# Patient Record
Sex: Male | Born: 1935 | Race: Black or African American | Hispanic: No | Marital: Single | Smoking: Former smoker
Health system: Southern US, Community
[De-identification: ages and names within clinical notes are randomized; demographics above are authoritative.]

## PROBLEM LIST (undated history)

## (undated) DIAGNOSIS — E440 Moderate protein-calorie malnutrition: Secondary | ICD-10-CM

## (undated) DIAGNOSIS — I10 Essential (primary) hypertension: Secondary | ICD-10-CM

## (undated) DIAGNOSIS — R51 Headache: Secondary | ICD-10-CM

## (undated) DIAGNOSIS — C801 Malignant (primary) neoplasm, unspecified: Secondary | ICD-10-CM

## (undated) DIAGNOSIS — F0280 Dementia in other diseases classified elsewhere without behavioral disturbance: Secondary | ICD-10-CM

## (undated) DIAGNOSIS — R519 Headache, unspecified: Secondary | ICD-10-CM

## (undated) DIAGNOSIS — G309 Alzheimer's disease, unspecified: Secondary | ICD-10-CM

## (undated) DIAGNOSIS — I639 Cerebral infarction, unspecified: Secondary | ICD-10-CM

## (undated) HISTORY — PX: FRACTURE SURGERY: SHX138

---

## 2012-07-16 ENCOUNTER — Other Ambulatory Visit (HOSPITAL_COMMUNITY): Payer: Self-pay | Admitting: Family Medicine

## 2012-07-16 DIAGNOSIS — R918 Other nonspecific abnormal finding of lung field: Secondary | ICD-10-CM

## 2012-07-18 ENCOUNTER — Ambulatory Visit (HOSPITAL_COMMUNITY)
Admission: RE | Admit: 2012-07-18 | Discharge: 2012-07-18 | Disposition: A | Discharge status: Home / Self Care | Payer: Medicare Other | Source: Ambulatory Visit | Attending: Family Medicine | Admitting: Family Medicine

## 2012-07-18 DIAGNOSIS — R918 Other nonspecific abnormal finding of lung field: Secondary | ICD-10-CM | POA: Insufficient documentation

## 2012-07-18 DIAGNOSIS — R222 Localized swelling, mass and lump, trunk: Secondary | ICD-10-CM | POA: Insufficient documentation

## 2012-07-18 DIAGNOSIS — K7689 Other specified diseases of liver: Secondary | ICD-10-CM | POA: Insufficient documentation

## 2012-07-18 LAB — POCT I-STAT, CHEM 8
Chloride: 102 mEq/L (ref 96–112)
Glucose, Bld: 115 mg/dL — ABNORMAL HIGH (ref 70–99)
HCT: 43 % (ref 39.0–52.0)
Hemoglobin: 14.6 g/dL (ref 13.0–17.0)
Potassium: 4.3 mEq/L (ref 3.5–5.1)
Sodium: 138 mEq/L (ref 135–145)

## 2012-07-18 MED ORDER — IOHEXOL 300 MG/ML  SOLN
100.0000 mL | Freq: Once | INTRAMUSCULAR | Status: AC | PRN
  Administered 2012-07-18: 80 mL via INTRAVENOUS

## 2012-07-25 ENCOUNTER — Other Ambulatory Visit (HOSPITAL_COMMUNITY): Payer: Self-pay | Admitting: Family Medicine

## 2012-07-25 DIAGNOSIS — R918 Other nonspecific abnormal finding of lung field: Secondary | ICD-10-CM

## 2012-07-30 ENCOUNTER — Other Ambulatory Visit: Payer: Self-pay | Admitting: Radiology

## 2012-07-30 ENCOUNTER — Other Ambulatory Visit (HOSPITAL_COMMUNITY): Payer: Medicare Other

## 2012-07-31 ENCOUNTER — Ambulatory Visit (HOSPITAL_COMMUNITY)
Admission: RE | Admit: 2012-07-31 | Discharge: 2012-07-31 | Disposition: A | Discharge status: Home / Self Care | Payer: Medicare Other | Source: Ambulatory Visit | Attending: Family Medicine | Admitting: Family Medicine

## 2012-07-31 ENCOUNTER — Encounter (HOSPITAL_COMMUNITY): Payer: Self-pay

## 2012-07-31 ENCOUNTER — Ambulatory Visit (HOSPITAL_COMMUNITY)
Admission: RE | Admit: 2012-07-31 | Discharge: 2012-07-31 | Disposition: A | Discharge status: Home / Self Care | Payer: Medicare Other | Source: Ambulatory Visit | Attending: Diagnostic Radiology | Admitting: Diagnostic Radiology

## 2012-07-31 ENCOUNTER — Ambulatory Visit (HOSPITAL_COMMUNITY)
Admission: RE | Admit: 2012-07-31 | Discharge: 2012-07-31 | Disposition: A | Discharge status: Home / Self Care | Payer: Medicare Other | Source: Ambulatory Visit | Attending: Radiology | Admitting: Radiology

## 2012-07-31 DIAGNOSIS — R222 Localized swelling, mass and lump, trunk: Secondary | ICD-10-CM | POA: Insufficient documentation

## 2012-07-31 DIAGNOSIS — C3410 Malignant neoplasm of upper lobe, unspecified bronchus or lung: Secondary | ICD-10-CM | POA: Insufficient documentation

## 2012-07-31 DIAGNOSIS — R918 Other nonspecific abnormal finding of lung field: Secondary | ICD-10-CM

## 2012-07-31 DIAGNOSIS — F0150 Vascular dementia without behavioral disturbance: Secondary | ICD-10-CM | POA: Insufficient documentation

## 2012-07-31 HISTORY — DX: Cerebral infarction, unspecified: I63.9

## 2012-07-31 HISTORY — DX: Alzheimer's disease, unspecified: G30.9

## 2012-07-31 HISTORY — DX: Essential (primary) hypertension: I10

## 2012-07-31 HISTORY — DX: Dementia in other diseases classified elsewhere, unspecified severity, without behavioral disturbance, psychotic disturbance, mood disturbance, and anxiety: F02.80

## 2012-07-31 LAB — CBC
Hemoglobin: 12.9 g/dL — ABNORMAL LOW (ref 13.0–17.0)
MCH: 29.5 pg (ref 26.0–34.0)
MCHC: 34.3 g/dL (ref 30.0–36.0)
Platelets: 253 10*3/uL (ref 150–400)
RDW: 14.8 % (ref 11.5–15.5)

## 2012-07-31 LAB — APTT: aPTT: 33 seconds (ref 24–37)

## 2012-07-31 LAB — PROTIME-INR
INR: 1 (ref 0.00–1.49)
Prothrombin Time: 13.1 seconds (ref 11.6–15.2)

## 2012-07-31 MED ORDER — HYDROCODONE-ACETAMINOPHEN 5-325 MG PO TABS: 1.0000 | ORAL_TABLET | ORAL | Status: DC | PRN

## 2012-07-31 MED ORDER — SODIUM CHLORIDE 0.9 % IV SOLN
Freq: Once | INTRAVENOUS | Status: AC
  Administered 2012-07-31: 08:00:00 via INTRAVENOUS

## 2012-07-31 MED ORDER — MIDAZOLAM HCL 2 MG/2ML IJ SOLN
INTRAMUSCULAR | Status: DC | PRN
  Administered 2012-07-31: 1 mg via INTRAVENOUS

## 2012-07-31 MED ORDER — FENTANYL CITRATE 0.05 MG/ML IJ SOLN
INTRAMUSCULAR | Status: DC | PRN
  Administered 2012-07-31: 50 ug via INTRAVENOUS

## 2012-07-31 MED ORDER — MIDAZOLAM HCL 2 MG/2ML IJ SOLN
INTRAMUSCULAR | Status: AC
  Filled 2012-07-31: qty 6

## 2012-07-31 MED ORDER — FENTANYL CITRATE 0.05 MG/ML IJ SOLN
INTRAMUSCULAR | Status: AC
  Filled 2012-07-31: qty 4

## 2012-07-31 NOTE — Procedures (Signed)
CT guided biopsy of left upper lobe mass.  3 cores obtained.  No immediate complication.

## 2012-07-31 NOTE — Progress Notes (Signed)
Eval of pt after observation of post Bx PTX. Repeat CXR shows no change/worsening of (L)PTX, 5-10% Pt feeling fine, denies acute SOB or chest discomfort. Bx site clean, NT, no sub-q emphysema palpable.  Pt ok for discharge. Discussed precautions and sxs to be aware of.  [place], [facility]

## 2012-07-31 NOTE — H&P (Signed)
William Walters is an 79 y.o. male.   Chief Complaint: Left lung mass Hx lung Ca yrs ago- refused treatment then Now living with Dtr in [place]; evaluated by Dr Everlene Other Now scheduled for Left lung mass Bx Need dx for possible treatment HPI: CVA; HTN; mild confusion  Past Medical History  Diagnosis Date  . Hypertension   . Stroke   . Alzheimer disease     History reviewed. No pertinent past surgical history.  No family history on file. Social History:  has no tobacco, alcohol, and drug history on file.  Allergies: No Known Allergies   (Not in a hospital admission)  Results for orders placed during the hospital encounter of 07/31/12 (from the past 48 hour(s))  APTT     Status: None   Collection Time    07/31/12  7:50 AM      Result Value Range   aPTT 33  24 - 37 seconds  CBC     Status: Abnormal   Collection Time    07/31/12  7:50 AM      Result Value Range   WBC 12.6 (*) 4.0 - 10.5 K/uL   RBC 4.38  4.22 - 5.81 MIL/uL   Hemoglobin 12.9 (*) 13.0 - 17.0 g/dL   HCT 14.7 (*) 82.9 - 56.2 %   MCV 85.8  78.0 - 100.0 fL   MCH 29.5  26.0 - 34.0 pg   MCHC 34.3  30.0 - 36.0 g/dL   RDW 13.0  86.5 - 78.4 %   Platelets 253  150 - 400 K/uL  PROTIME-INR     Status: None   Collection Time    07/31/12  7:50 AM      Result Value Range   Prothrombin Time 13.1  11.6 - 15.2 seconds   INR 1.00  0.00 - 1.49   No results found.  Review of Systems  Constitutional: Positive for weight loss. Negative for fever.  Respiratory: Positive for shortness of breath.   Cardiovascular: Negative for chest pain.  Gastrointestinal: Negative for nausea and vomiting.  Neurological: Positive for focal weakness and weakness.       Rt side arm without use; Rt leg weak post CVA  Psychiatric/Behavioral: Positive for memory loss.       Mild confusion    Blood pressure 153/91, pulse 113, temperature 96.8 F (36 C), temperature source Oral, resp. rate 16, height 6\' 1"  (1.854 m), weight 145 lb (65.772 kg), SpO2  92.00%. Physical Exam  Constitutional: He is oriented to person, place, and time.  Thin; weak; frail  Cardiovascular: Normal rate, regular rhythm and normal heart sounds.   No murmur heard. Respiratory: Effort normal and breath sounds normal. He has no wheezes.  GI: Soft. Bowel sounds are normal. There is no tenderness.  Musculoskeletal:  Moves left well Rt arm; no use Rt leg able to use minimally Rt foot tender; swollen  Neurological: He is alert and oriented to person, place, and time.  Psychiatric: He has a normal mood and affect. His behavior is normal.  Mild confusion Alert and oriented to person; place; time; reason for procedure     Assessment/Plan Left lung mass; Hx Lung Ca yrs ago per chart Refused treatment then Scheduled for lung mass bx now Pt aware of procedure benefits and risks and agreeable to proceed I also spoke to DTR on phone- she is aware of procedure  benefits and risks and agreeable to proceed Consent signed and in chart  Rasheema Truluck A 07/31/2012, 8:49 AM

## 2012-07-31 NOTE — ED Notes (Signed)
For CXRay at 12:15, will transport to C1 after.

## 2012-08-05 ENCOUNTER — Telehealth: Payer: Self-pay | Admitting: *Deleted

## 2012-08-05 NOTE — Telephone Encounter (Signed)
Spoke with daughter regarding appt.  She is unable to bring pt at this time and will call back

## 2012-08-11 ENCOUNTER — Telehealth: Payer: Self-pay | Admitting: *Deleted

## 2012-08-11 NOTE — Telephone Encounter (Signed)
Spoke with family regarding appt.  She ask I call back and leave on vm.  I called with appt [facility] 08/21/12 at 3:45 with location.

## 2012-08-15 ENCOUNTER — Telehealth: Payer: Self-pay | Admitting: *Deleted

## 2012-08-15 NOTE — Telephone Encounter (Signed)
Spoke with daughter regarding appt next week.  Surgeon would like PET scan before seeing pt.  I tried to call referring office with no answer.  I will call Monday to scheudle PET.  Daughter verbalized understanding off appt canceled for 08/21/12

## 2012-08-19 ENCOUNTER — Telehealth: Payer: Self-pay | Admitting: *Deleted

## 2012-08-19 ENCOUNTER — Other Ambulatory Visit (HOSPITAL_COMMUNITY): Payer: Self-pay | Admitting: Family Medicine

## 2012-08-19 ENCOUNTER — Encounter: Payer: Self-pay | Admitting: *Deleted

## 2012-08-19 DIAGNOSIS — C3490 Malignant neoplasm of unspecified part of unspecified bronchus or lung: Secondary | ICD-10-CM

## 2012-08-19 NOTE — Progress Notes (Signed)
Called referring office regarding PET scan.  They will order scan

## 2012-08-22 ENCOUNTER — Other Ambulatory Visit (HOSPITAL_COMMUNITY): Payer: Medicare Other

## 2012-08-26 ENCOUNTER — Encounter (HOSPITAL_COMMUNITY)
Admission: RE | Admit: 2012-08-26 | Discharge: 2012-08-26 | Disposition: A | Discharge status: Home / Self Care | Payer: Medicare Other | Source: Ambulatory Visit | Attending: Family Medicine | Admitting: Family Medicine

## 2012-08-26 DIAGNOSIS — C3490 Malignant neoplasm of unspecified part of unspecified bronchus or lung: Secondary | ICD-10-CM | POA: Insufficient documentation

## 2012-08-26 MED ORDER — FLUDEOXYGLUCOSE F - 18 (FDG) INJECTION
16.3000 | Freq: Once | INTRAVENOUS | Status: AC | PRN
  Administered 2012-08-26: 16.3 via INTRAVENOUS

## 2012-08-28 ENCOUNTER — Emergency Department (HOSPITAL_COMMUNITY): Payer: Medicare Other

## 2012-08-28 ENCOUNTER — Other Ambulatory Visit: Payer: Self-pay

## 2012-08-28 ENCOUNTER — Encounter (HOSPITAL_COMMUNITY): Payer: Self-pay | Admitting: Emergency Medicine

## 2012-08-28 ENCOUNTER — Encounter: Payer: Self-pay | Admitting: Radiation Oncology

## 2012-08-28 ENCOUNTER — Encounter: Payer: Self-pay | Admitting: *Deleted

## 2012-08-28 ENCOUNTER — Ambulatory Visit
Admission: RE | Admit: 2012-08-28 | Discharge: 2012-08-28 | Disposition: A | Discharge status: Home / Self Care | Payer: Medicare Other | Source: Ambulatory Visit | Attending: Radiation Oncology | Admitting: Radiation Oncology

## 2012-08-28 ENCOUNTER — Telehealth: Payer: Self-pay | Admitting: *Deleted

## 2012-08-28 ENCOUNTER — Institutional Professional Consult (permissible substitution) (INDEPENDENT_AMBULATORY_CARE_PROVIDER_SITE_OTHER): Payer: Medicare Other | Admitting: Thoracic Surgery (Cardiothoracic Vascular Surgery)

## 2012-08-28 ENCOUNTER — Emergency Department (HOSPITAL_COMMUNITY)
Admission: EM | Admit: 2012-08-28 | Discharge: 2012-08-28 | Disposition: A | Discharge status: Home / Self Care | Payer: Medicare Other | Attending: Emergency Medicine | Admitting: Emergency Medicine

## 2012-08-28 ENCOUNTER — Other Ambulatory Visit: Payer: Self-pay | Admitting: *Deleted

## 2012-08-28 DIAGNOSIS — Z79899 Other long term (current) drug therapy: Secondary | ICD-10-CM | POA: Insufficient documentation

## 2012-08-28 DIAGNOSIS — L97909 Non-pressure chronic ulcer of unspecified part of unspecified lower leg with unspecified severity: Secondary | ICD-10-CM

## 2012-08-28 DIAGNOSIS — F0280 Dementia in other diseases classified elsewhere without behavioral disturbance: Secondary | ICD-10-CM

## 2012-08-28 DIAGNOSIS — R059 Cough, unspecified: Secondary | ICD-10-CM | POA: Insufficient documentation

## 2012-08-28 DIAGNOSIS — I10 Essential (primary) hypertension: Secondary | ICD-10-CM | POA: Insufficient documentation

## 2012-08-28 DIAGNOSIS — C3490 Malignant neoplasm of unspecified part of unspecified bronchus or lung: Secondary | ICD-10-CM | POA: Insufficient documentation

## 2012-08-28 DIAGNOSIS — I2699 Other pulmonary embolism without acute cor pulmonale: Secondary | ICD-10-CM | POA: Insufficient documentation

## 2012-08-28 DIAGNOSIS — Z8673 Personal history of transient ischemic attack (TIA), and cerebral infarction without residual deficits: Secondary | ICD-10-CM | POA: Insufficient documentation

## 2012-08-28 DIAGNOSIS — C3410 Malignant neoplasm of upper lobe, unspecified bronchus or lung: Secondary | ICD-10-CM

## 2012-08-28 DIAGNOSIS — R918 Other nonspecific abnormal finding of lung field: Secondary | ICD-10-CM

## 2012-08-28 DIAGNOSIS — R222 Localized swelling, mass and lump, trunk: Secondary | ICD-10-CM | POA: Insufficient documentation

## 2012-08-28 DIAGNOSIS — G309 Alzheimer's disease, unspecified: Secondary | ICD-10-CM | POA: Insufficient documentation

## 2012-08-28 DIAGNOSIS — F0390 Unspecified dementia without behavioral disturbance: Secondary | ICD-10-CM

## 2012-08-28 DIAGNOSIS — R05 Cough: Secondary | ICD-10-CM | POA: Insufficient documentation

## 2012-08-28 LAB — CBC WITH DIFFERENTIAL/PLATELET
Basophils Relative: 1 % (ref 0–1)
Eosinophils Absolute: 0.7 10*3/uL (ref 0.0–0.7)
Eosinophils Relative: 7 % — ABNORMAL HIGH (ref 0–5)
HCT: 34.2 % — ABNORMAL LOW (ref 39.0–52.0)
Hemoglobin: 11.2 g/dL — ABNORMAL LOW (ref 13.0–17.0)
MCH: 28.6 pg (ref 26.0–34.0)
MCHC: 32.7 g/dL (ref 30.0–36.0)
MCV: 87.2 fL (ref 78.0–100.0)
Monocytes Absolute: 0.7 10*3/uL (ref 0.1–1.0)
Monocytes Relative: 7 % (ref 3–12)

## 2012-08-28 LAB — COMPREHENSIVE METABOLIC PANEL
Albumin: 3.1 g/dL — ABNORMAL LOW (ref 3.5–5.2)
BUN: 13 mg/dL (ref 6–23)
Calcium: 9.1 mg/dL (ref 8.4–10.5)
Chloride: 99 mEq/L (ref 96–112)
Creatinine, Ser: 1.12 mg/dL (ref 0.50–1.35)
Total Bilirubin: 0.2 mg/dL — ABNORMAL LOW (ref 0.3–1.2)
Total Protein: 7.4 g/dL (ref 6.0–8.3)

## 2012-08-28 LAB — TROPONIN I: Troponin I: <0.3 ng/mL (ref <0.30)

## 2012-08-28 LAB — PROTIME-INR: Prothrombin Time: 12.4 seconds (ref 11.6–15.2)

## 2012-08-28 MED ORDER — RIVAROXABAN 15 MG PO TABS: 15.0000 mg | ORAL_TABLET | Freq: Two times a day (BID) | ORAL | Status: DC

## 2012-08-28 MED ORDER — IOHEXOL 350 MG/ML SOLN
100.0000 mL | Freq: Once | INTRAVENOUS | Status: AC | PRN
  Administered 2012-08-28: 100 mL via INTRAVENOUS

## 2012-08-28 MED ORDER — ENOXAPARIN SODIUM 60 MG/0.6ML ~~LOC~~ SOLN
65.0000 mg | Freq: Once | SUBCUTANEOUS | Status: DC
Start: 2012-08-28 — End: 2012-08-28
  Filled 2012-08-28: qty 1.2

## 2012-08-28 MED ORDER — ENOXAPARIN SODIUM 80 MG/0.8ML ~~LOC~~ SOLN
65.0000 mg | SUBCUTANEOUS | Status: AC
  Administered 2012-08-28: 65 mg via SUBCUTANEOUS
  Filled 2012-08-28: qty 0.8

## 2012-08-28 NOTE — Progress Notes (Signed)
PCP is Aura Dials, MD Referring Provider is No ref. provider found  No chief complaint on file.   HPI: William Walters is a 79 year old gentleman with a history of tobacco abuse and for evaluation of a left upper lobe mass.  79 year old gentleman with a history of multiple strokes with residual right hemiparesis and Alzheimer's. He also has a history of tobacco abuse. It is unclear exactly when he quit smoking as he says 55 years and his daughters think it's more like 5 years ago. Apparently was smoking about a pack a day at one time. He had been living in [place] until recently moving to [place] in January. His daughter states he may have been on a blood thinner at one time, but he was not taking it when he moved here. He currently is in a wheelchair and is in a wheelchair at much of the time. He says he can walk somewhat a cane. He has essentially no use of the right arm. His daughter say that he has had difficulty with speech and he has severe memory problems.  He apparently have been fully of lung cancer before but had refused treatment. After moving to [facility] he was seen by Dr. Saunders Glance. A CT scan was done on 07/31/2012 which showed a large left upper lobe mass abutting the mediastinum. This led to a PET/CT which showed the mass to be hypermetabolic. There was no evidence of hilar or mediastinal adenopathy or distant metastases. Dr. Everlene Other ordered a biopsy, which revealed squamous cell carcinoma.  Zubrod  Score = 3 Past Medical History  Diagnosis Date  . Hypertension   . Stroke   . Alzheimer disease     No past surgical history on file.  No family history on file.  Social History History  Substance Use Topics  . Smoking status: Not on file  . Smokeless tobacco: Not on file  . Alcohol Use: Not on file   one pack per day for approximately 50 years  Current Outpatient Prescriptions  Medication Sig Dispense Refill  . captopril (CAPOTEN) 50 MG tablet Take 100 mg by  mouth 2 (two) times daily.      . cyanocobalamin (,VITAMIN B-12,) 1000 MCG/ML injection Inject 1,000 mcg into the muscle See admin instructions. Patient recieves every other month.      . hydrochlorothiazide (HYDRODIURIL) 25 MG tablet Take 25 mg by mouth daily.       No current facility-administered medications for this visit.    No Known Allergies  Review of Systems  Unable to perform ROS: Dementia  Constitutional: Positive for activity change. Negative for appetite change and unexpected weight change (Gained weight since moving to [place] to live with daughter).       Limited review of systems due to patient's dementia. Notable findings per the patient's daughters  Respiratory: Positive for cough. Negative for wheezing.   Cardiovascular: Positive for leg swelling (Recent podiatry procedure on right foot). Negative for chest pain.  Neurological: Positive for speech difficulty and weakness (Right arm near complete paralysis, right leg weakness).    BP 120/58  Pulse 85  Temp(Src) 98.5 F (36.9 C)  Resp 20  Wt 145 lb 3.2 oz (65.862 kg) Physical Exam  Vitals reviewed. Constitutional: He is oriented to person, place, and time.  Elderly gentleman in no acute distress  HENT:  Head: Normocephalic and atraumatic.  Eyes: EOM are normal.  Neck: Neck supple. No thyromegaly present.  Cardiovascular: Normal rate and regular rhythm.  Exam reveals no  gallop and no friction rub.   No murmur heard. Pulmonary/Chest: He has no wheezes.  Diminished breath sounds bilaterally otherwise clear  Abdominal: Soft. There is no tenderness.  Musculoskeletal: He exhibits edema (1+ edema right leg).  Lymphadenopathy:    He has no cervical adenopathy.  Neurological: He is alert and oriented to person, place, and time. No cranial nerve deficit.  Skin: Skin is warm and dry.     Diagnostic Tests: CT chest 07/18/12 *RADIOLOGY REPORT*  Clinical Data: Abnormal chest radiograph  CT CHEST WITH CONTRAST   Technique: Multidetector CT imaging of the chest was performed  following the standard protocol during bolus administration of  intravenous contrast.  Contrast: 80mL OMNIPAQUE IOHEXOL 300 MG/ML SOLN  Comparison: None.  Findings: Within the left upper lobe, there is a rounded 4.9 x 4.3  cm mass which appears to be associated with the left upper lobe  bronchus. Mass extends inferiorly to the level of the left main  pulmonary artery.  There are no pathologic enlarged mediastinal lymph nodes. No  pericardial fluid. Coronary calcifications are present. Esophagus  is normal.  No axillary or supraclavicular lymphadenopathy.  Limited view of the upper abdomen partially images the adrenal  glands which appear normal. Tiny hypodensity within the right  hepatic lobe measuring 5 mm (image 54). Similar 5 ml of  hypodensity within the central left hepatic lobe on the same image  and small right lateral hepatic lobe 4 mm lesion also on the same  image.  No aggressive osseous lesions.  IMPRESSION:  1. Left upper lobe mass extending to the left suprahilar region  is most concerning for bronchogenic carcinoma. Recommend a  bronchoscopy or percutaneous CT guided biopsy for tissue sampling.  2. No evidence of mediastinal lymphadenopathy.  3. Tiny hypodensities within the liver are too small to  characterize. Consider contrast MRI for further evaluation.  4. Consider outpatient FDG PET CT scan for staging.       **ADDENDUM** CREATED: 08/27/2012 09:33:38  Upon further review, there is a filling defect within the right  main pulmonary artery (image 35, series two) consistent with a  pulmonary embolism. These findings will be discussed at the  interdisciplinary thoracic cancer conference on 08/28/2012  **END ADDENDUM** SIGNED BY: Genevive Bi, M.D.   PET/CT 08/26/12 *RADIOLOGY REPORT*  Clinical Data: Initial treatment strategy for lung cancer.  NUCLEAR MEDICINE PET SKULL BASE TO THIGH  Fasting  Blood Glucose: 97  Technique: 16.3 mCi F-18 FDG was injected intravenously. CT data  was obtained and used for attenuation correction and anatomic  localization only. (This was not acquired as a diagnostic CT  examination.) Additional exam technical data entered on  technologist worksheet.  Comparison: Chest CT 07/18/2012.  Findings:  Neck: No hypermetabolic lymph nodes in the neck.  Chest: Again noted is a large macrolobulated mass measuring  approximate 5.3 x 4.9 cm which makes a broad contact with the  pleura adjacent to the lateral aspect of the aortic arch, as well  as anteriorly and superiorly. This lesion is hypermetabolic  (SUVmax = 19.5), although there is relative hypometabolism  centrally, which is likely related to central necrosis in this  biopsy proven squamous cell carcinoma. No definite hypermetabolic  mediastinal or hilar lymphadenopathy. Numerous bilateral hilar and  mediastinal calcified lymph nodes are noted. Heart size is mildly  enlarged. There is atherosclerosis of the thoracic aorta, the great  vessels of the mediastinum and the coronary arteries, including  calcified atherosclerotic plaque in the left main,  left anterior  descending, left circumflex and right coronary arteries. There is a  background of moderate centrilobular emphysema. Patchy ground-  glass attenuation and septal thickening is noted in the right lower  lobe inferiorly, which may represent sequela of recent mild  aspiration. No pneumothorax. No pleural effusions.  Abdomen/Pelvis: No abnormal hypermetabolic activity within the  liver, pancreas, adrenal glands, or spleen. No hypermetabolic  lymph nodes in the abdomen or pelvis. Several calcifications are  noted in the left renal hilum, the majority of which favored to be  vascular. However, there is at least one 5 mm nonobstructive  calculus in the lower pole collecting system of the left kidney  (image 142 of series 2). Extensive  atherosclerosis throughout the  abdominal and pelvic vasculature, without definite aneurysm.  Skeleton: No focal hypermetabolic activity to suggest skeletal  metastasis. Old healed fracture of the right proximal femur with  postoperative changes of cerclage fixation incompletely imaged.  IMPRESSION:  1. 5.3 x 4.9 cm pleural based macrolobulated hypermetabolic left  upper lobe mass with central necrosis, compatible with the biopsy  proven squamous cell carcinoma. No associated mediastinal or hilar  lymphadenopathy, and no definite signs of distal metastatic disease  on today's examination. Findings are compatible with T2b, N0, Mx  disease (i.e., likely stage IIA).  2. 5 mm nonobstructive calculus in the lower pole collecting system  of the left kidney.  3. Atherosclerosis, including left main and three-vessel coronary  artery disease. Assessment for potential risk factor modification,  dietary therapy or pharmacologic therapy may be warranted, if  clinically indicated.  4. Additional incidental findings, as above.  Original Report Authenticated By: Trudie Reed, M.D.    Impression: 79 year old gentleman with a remote history of tobacco abuse who has had multiple strokes and also has a history of Alzheimer's disease. He has a new 5.3 x 4.9 cm left upper lobe mass was hypermetabolic by PET. Biopsy has shown this to be squamous cell carcinoma. He has no evidence of regional or distant metastases. He does however have evidence of a pulmonary embolus in the right main pulmonary artery that was noted on repeat review of the scans.  In my opinion William Walters is not a surgical candidate. There is a good chance that this lesion is actually a T4 lesion invading the mediastinum although does not definite by CT. Given the mass is positioning of the main pulmonary artery it likely would require pneumonectomy. He is not a candidate for a lobectomy based on his level of functioning currently.  I  discussed these issues with the patient and his daughters. He seemed to have limited understanding that his daughters do understand my concerns.  He will be seen by Dr. Margaretmary Dys a radiation oncology to discuss primary radiation therapy.  After he seen by Dr. Kathrynn Running we will send him to the emergency room for probable admission for treatment of his pulmonary embolus.  Plan: Evaluation by radiation oncology  Treat pulmonary embolus  I will be happy to see William Walters back if there is any way I can be of any further assistance with his care

## 2012-08-28 NOTE — ED Notes (Signed)
Patient transported to CT 

## 2012-08-28 NOTE — Progress Notes (Signed)
Admitting called asking for orders for Mr. Larenz Frasier because Mr Doberstein had been advised by Dr. Kathrynn Running  to go to [facility] ED.   During the multidisciplinary thoracic clinic  Today it was noted by Dr. Kathrynn Running and the other participating physicians,  that Mr. Cipollone has a Right Main Pulmonary Artery consistent with a  Pulmonary Embolism on his 07/21/12 CT Scan of his chest.  Mr. Cappelletti was advised to go to the ED to be assessed.  Called Harrell Gave, ED Charge Nurse, and relayed the prior information.  It was determined that Mr. Callens needed to be assessed by an ED physician then proceed to having a repeat CT angiogram. Dr. Kathrynn Running informed.

## 2012-08-28 NOTE — ED Provider Notes (Addendum)
History     CSN: [id number]  Arrival date & time 08/28/12  1743   First MD Initiated Contact with Patient 08/28/12 1758      Chief Complaint  Patient presents with  . Shortness of Breath    (Consider location/radiation/quality/duration/timing/severity/associated sxs/prior treatment) HPI Comments: Patient presents from the radiation oncology clinic with potential pulmonary embolism. He recently diagnosed squamous cell lung cancer and is images are being reviewed today from March. It was thought that he may have a pulmonary embolism so he was sent to the ED. He denies any chest pain, shortness of breath, palpitations. He has a history of Alzheimer's as well as previous stroke and daughter confirms he is at his baseline. Limited movement of his right side. He is not yet received any chemotherapy or radiation. He has had good by mouth intake and urine output. No fevers.  The history is provided by the patient.    Past Medical History  Diagnosis Date  . Hypertension   . Stroke   . Alzheimer disease     History reviewed. No pertinent past surgical history.  History reviewed. No pertinent family history.  History  Substance Use Topics  . Smoking status: Not on file  . Smokeless tobacco: Not on file  . Alcohol Use: Not on file      Review of Systems  Constitutional: Negative for fever, activity change, appetite change and fatigue.  HENT: Negative for congestion and rhinorrhea.   Eyes: Negative for visual disturbance.  Respiratory: Positive for cough. Negative for chest tightness and shortness of breath.   Cardiovascular: Negative for chest pain.  Gastrointestinal: Negative for nausea, vomiting and abdominal pain.  Genitourinary: Negative for testicular pain.  Musculoskeletal: Negative for back pain.  Neurological: Negative for dizziness, weakness and headaches.  A complete 10 system review of systems was obtained and all systems are negative except as noted in the HPI and  PMH.    Allergies  Review of patient's allergies indicates no known allergies.  Home Medications   Current Outpatient Rx  Name  Route  Sig  Dispense  Refill  . captopril (CAPOTEN) 50 MG tablet   Oral   Take 100 mg by mouth 2 (two) times daily.         . hydrochlorothiazide (HYDRODIURIL) 25 MG tablet   Oral   Take 25 mg by mouth daily.         Marland Kitchen sulfamethoxazole-trimethoprim (BACTRIM DS) 800-160 MG per tablet   Oral   Take 1 tablet by mouth 2 (two) times daily.           BP 123/108  Pulse 72  Temp(Src) 98 F (36.7 C) (Oral)  Resp 14  SpO2 100%  Physical Exam  Constitutional: He is oriented to person, place, and time. He appears well-developed and well-nourished. No distress.  HENT:  Head: Normocephalic and atraumatic.  Mouth/Throat: Oropharynx is clear and moist. No oropharyngeal exudate.  Eyes: Conjunctivae and EOM are normal. Pupils are equal, round, and reactive to light.  Neck: Normal range of motion. Neck supple.  Cardiovascular: Normal rate, regular rhythm and normal heart sounds.   Pulmonary/Chest: Effort normal and breath sounds normal. No respiratory distress.  Abdominal: Soft. There is no tenderness. There is no rebound and no guarding.  Musculoskeletal: Normal range of motion. He exhibits no edema and no tenderness.  Neurological: He is alert and oriented to person, place, and time. No cranial nerve deficit. He exhibits normal muscle tone. Coordination normal.  Right arm contracted,  right-sided weakness at baseline  Skin: Skin is warm.    ED Course  Procedures (including critical care time)  Labs Reviewed  CBC WITH DIFFERENTIAL - Abnormal; Notable for the following:    RBC 3.92 (*)    Hemoglobin 11.2 (*)    HCT 34.2 (*)    Eosinophils Relative 7 (*)    All other components within normal limits  COMPREHENSIVE METABOLIC PANEL - Abnormal; Notable for the following:    Sodium 133 (*)    Albumin 3.1 (*)    Total Bilirubin 0.2 (*)    GFR calc non  Af Amer 61 (*)    GFR calc Af Amer 71 (*)    All other components within normal limits  TROPONIN I  PROTIME-INR   Dg Chest 2 View  08/28/2012  *RADIOLOGY REPORT*  Clinical Data: Shortness of breath.  Weakness.Lung mass.  CHEST - 2 VIEW  Comparison: Multiple exams, including 08/26/2012  Findings: Emphysema noted along with a left upper lobe mass similar in size to recent prior examinations.  There is subsegmental atelectasis adjacent to the right hemidiaphragm.  Heart size within normal limits for technique.  No acute thoracic findings are observed.  IMPRESSION:  1.  Essentially stable left upper lobe mass. 2.  Subsegmental atelectasis along the right hemidiaphragm.   Original Report Authenticated By: Gaylyn Rong, M.D.    Ct Angio Chest Pe W/cm &/or Wo Cm  08/28/2012  *RADIOLOGY REPORT*  Clinical Data: Pulmonary embolism.  Lung cancer.  Pulmonary embolism on prior CT.  CT ANGIOGRAPHY CHEST  Technique:  Multidetector CT imaging of the chest using the standard protocol during bolus administration of intravenous contrast. Multiplanar reconstructed images including MIPs were obtained and reviewed to evaluate the vascular anatomy.  Contrast: OMNIPAQUE IOHEXOL 350 MG/ML SOLN  Comparison: 07/18/2012.  Findings: Technically adequate study. Tiny segmental and smaller pulmonary emboli are identified.  Represent of embolus is present in the right upper lobe pulmonary artery (image number 39 series 4).  Left lower lobe pulmonary embolus is also present (image 67 series 5).  There is no right heart strain.  Incidental imaging the upper abdomen demonstrates a tiny low density lesion in the right hepatic lobe probably representing a cyst that appears unchanged compared to prior.  Other smaller lesions are also present, stable compared to prior.  Old granulomatous disease of the spleen.  The spleen is diminutive.  No convincing evidence of an adrenal mass.  Mild thickening of the right adrenal gland appears  stable.  The left upper lobe mass abutting the apex and left mediastinal surface as well as left anterior pleural surface has enlarged compared to the presenting exam of 07/18/2012.  On axial imaging, the mass now measures 57 mm AP by 46 mm transverse (previously 49 mm x 44 mm).  Craniocaudal extent remains the same, with extension from the left hilum to the left apex.  This mass has encased and occluded the left upper lobe pulmonary artery with the stump of the left upper lobe pulmonary artery visible on image number 36.  Presumably, the lung is being fed from collateral flow from the bronchial arteries.  Emphysema is present.  Dependent atelectasis.  Thoracic spondylosis and exaggerated kyphosis.  No aggressive osseous lesions. Aortic arch atherosclerosis. Old granulomatous calcifications in the mediastinal lymph nodes appears similar.  No discrete adenopathy.  IMPRESSION: 1. Positive for pulmonary embolus.  Segmental bilateral pulmonary emboli are present without right heart strain. Probable chronic pulmonary emboli in the descending right pulmonary  artery.  No acute aortic abnormality. 2.  Enlarging left upper lobe mass encasing and occluding the left upper lobe pulmonary artery, today measuring 57 mm x 46 mm on axial imaging. 3.  Emphysema and basilar atelectasis.  Critical Value/emergent results were called by telephone at the time of interpretation on 08/28/2012. at 2021 hours to Dr.Maryland Luppino, who verbally acknowledged these results.   Original Report Authenticated By: Andreas Newport, M.D.      No diagnosis found.    MDM  History of squamous cell lung cancer presenting with concern for pulmonary embolism. He denies chest pain or shortness of breath. He's in no distress lungs are clear.  Chest x-ray is negative. EKG is normal sinus rhythm. CT scan is positive for multiple pulmonary emboli. No evidence of right heart strain. Patient is hemodynamically stable. Will start Lovenox and plan admission to  hospital service.  D/w Dr. Conley Rolls.  Patient remained stable in no distress. He has no hypoxia or tachycardia. CT scan shows improvement in his clot burden from previous scan. No evidence of metastases on recent PET scan.  Dr. Conley Rolls agrees with starting xarelto and having patient followup with oncologist. Platelets and creatinine acceptable. Patient and family in agreement with plan.  Message sent to Dr. Kathrynn Running.   Date: 08/28/2012  Rate: 75  Rhythm: normal sinus rhythm  QRS Axis: normal  Intervals: normal  ST/T Wave abnormalities: normal  Conduction Disutrbances:none  Narrative Interpretation: septal q waves  Old EKG Reviewed: none available      Glynn Octave, MD 08/28/12 [id number]  Glynn Octave, MD 08/29/12 1743

## 2012-08-28 NOTE — ED Notes (Signed)
MD at bedside. 

## 2012-08-28 NOTE — Progress Notes (Signed)
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 male with a history of multiple strokes and Alzheimer's. He also has a history of tobacco abuse. He had been living in [place] until recently moving to [place] in January.  After moving to [facility] he was seen by Dr. Everlene Other. A CT scan was done on 07/31/2012 showed a large left upper lobe mass abutting the mediastinum. This led to a PET/CT which showed the mass to be hypermetabolic. There was no evidence of hilar or mediastinal adenopathy or distant metastases. Dr. Everlene Other ordered a biopsy, which revealed squamous cell carcinoma.  Subsequent PET/CT and confirmed the presence of a 5.3 cm pleural-based macrolobulated hypermetabolic left upper lung mass with central necrosis. There is no mediastinal or hilar lymphadenopathy suggesting stage T2b. N0 (IIA).  The patient was referred for evaluation in our multidisciplinary conference and clinic today. During his radiology presentation in the conference, it pulmonary embolism was suspected in the right pulmonary artery. The patient is not felt to be an ideal surgical candidate given his past medical comorbidities.  PREVIOUS RADIATION THERAPY: No  PAST MEDICAL HISTORY:  has a past medical history of Hypertension; Stroke; and Alzheimer disease.    PAST SURGICAL HISTORY:History reviewed. No pertinent past surgical history.  FAMILY HISTORY: family history is not on file.  SOCIAL HISTORY:    ALLERGIES: Review of patient's allergies indicates no known allergies.  MEDICATIONS:  Current Outpatient Prescriptions  Medication  Sig Dispense Refill  . captopril (CAPOTEN) 50 MG tablet Take 100 mg by mouth 2 (two) times daily.      . cyanocobalamin (,VITAMIN B-12,) 1000 MCG/ML injection Inject 1,000 mcg into the muscle See admin instructions. Patient recieves every other month.      . hydrochlorothiazide (HYDRODIURIL) 25 MG tablet Take 25 mg by mouth daily.       No current facility-administered medications for this encounter.    REVIEW OF SYSTEMS:  A 15 point review of systems is documented in the electronic medical record. This was obtained by the nursing staff. However, I reviewed this with the patient to discuss relevant findings and make appropriate changes.  Pertinent items are noted in HPI.   PHYSICAL EXAM: BP 120/58  Pulse 85  Temp(Src) 98.5 F (36.9 C)  Resp 20  Wt 145 lb 3.2 oz (65.862 kg)  Physical Exam  Vitals reviewed.  Constitutional: He is oriented to person, place, and time.  Elderly gentleman in no acute distress  HENT:  Head: Normocephalic and atraumatic.  Eyes: EOM are normal.  Neck: Neck supple. No thyromegaly present.  Per thoracic surgery:  Cardiovascular: Normal rate and regular rhythm. Exam reveals no gallop and no friction rub.  No murmur heard.  Pulmonary/Chest: He has no wheezes.  Diminished breath sounds bilaterally otherwise clear  Abdominal: Soft. There is no tenderness.  Musculoskeletal: He exhibits edema (1+ edema right leg).  Lymphadenopathy:  He has no cervical adenopathy.  Neurological: He is alert and oriented to person, place, and time. No cranial nerve deficit.  Skin: Skin is warm and dry.     LABORATORY DATA:  Lab Results  Component Value Date  WBC 12.6* 07/31/2012   HGB 12.9* 07/31/2012   HCT 37.6* 07/31/2012   MCV 85.8 07/31/2012   PLT 253 07/31/2012   Lab Results  Component Value Date   NA 138 07/18/2012   K 4.3 07/18/2012   CL 102 07/18/2012   No results found for this basename: ALT,  AST,  GGT,  ALKPHOS,  BILITOT     RADIOGRAPHY: Dg Chest 1  View  07/31/2012  *RADIOLOGY REPORT*  Clinical Data: Post biopsy for left lung mass.  CHEST - 1 VIEW  Comparison: Chest CT 07/18/2012.  Findings: Known left upper lobe mass is again noted.  There appears to be a pleural line both in the left apex and in the lateral aspect of the left mid-upper hemithorax, suspicious for a small left-sided pneumothorax.  Lungs otherwise appear clear.  Pulmonary vasculature is within normal limits.  Heart size is normal. Atherosclerosis in the thoracic aorta.  IMPRESSION: 1.  Findings are suspicious for a small postbiopsy left-sided pneumothorax, and could be confirmed with repeat study if clinically indicated. 2.  Large left upper lobe mass again noted. 3.  Atherosclerosis.  These results were called by telephone on 07/31/2012 at 12:49 p.m. to Dr. Lowella Dandy, who verbally acknowledged these results.   Original Report Authenticated By: Trudie Reed, M.D.    Dg Chest 2 View  07/31/2012  *RADIOLOGY REPORT*  Clinical Data: Status post left-sided  biopsy.  CHEST - 2 VIEW  Comparison: Earlier today at 1222 hours and chest CT of 07/18/2012.  Findings: Midline trachea.  Normal heart size and mediastinal contours for age.  Approximately 10% left apical pneumothorax, not significantly changed since the exam of earlier in the day.  Left suprahilar lung mass again identified.  Right lung clear.  IMPRESSION: No significant change in approximately 10% left apical pneumothorax.  Left suprahilar lung mass, as before.   Original Report Authenticated By: Jeronimo Greaves, M.D.    Nm Pet Image Initial (pi) Skull Base To Thigh  08/27/2012  **ADDENDUM** CREATED: 08/27/2012 09:08:12  Upon further review, there is a filling defect within the right main pulmonary artery (image 35, series two) consistent with a pulmonary embolism.  These findings will be discussed at the interdisciplinary  thoracic cancer conference on 08/28/2012  **END ADDENDUM** SIGNED BY: Genevive Bi, M.D.   08/26/2012  *RADIOLOGY  REPORT*  Clinical Data: Initial treatment strategy for lung cancer.  NUCLEAR MEDICINE PET SKULL BASE TO THIGH  Fasting Blood Glucose:  97  Technique:  16.3 mCi F-18 FDG was injected intravenously. CT data was obtained and used for attenuation correction and anatomic localization only.  (This was not acquired as a diagnostic CT examination.) Additional exam technical data entered on technologist worksheet.  Comparison:  Chest CT 07/18/2012.  Findings:  Neck: No hypermetabolic lymph nodes in the neck.  Chest:  Again noted is a large macrolobulated mass measuring approximate 5.3 x 4.9 cm which makes a broad contact with the pleura adjacent to the lateral aspect of the aortic arch, as well as anteriorly and superiorly.  This lesion is hypermetabolic (SUVmax = 19.5), although there is relative hypometabolism centrally, which is likely related to central necrosis in this biopsy proven squamous cell carcinoma.  No definite hypermetabolic mediastinal or hilar lymphadenopathy.  Numerous bilateral hilar and mediastinal calcified lymph nodes are noted.  Heart size is mildly enlarged. There is atherosclerosis of the thoracic aorta, the great vessels of the mediastinum and the coronary arteries, including calcified atherosclerotic plaque in the left main, left  anterior descending, left circumflex and right coronary arteries. There is a background of moderate centrilobular emphysema.  Patchy ground- glass attenuation and septal thickening is noted in the right lower lobe inferiorly, which may represent sequela of recent mild aspiration.  No pneumothorax.  No pleural effusions.  Abdomen/Pelvis:  No abnormal hypermetabolic activity within the liver, pancreas, adrenal glands, or spleen.  No hypermetabolic lymph nodes in the abdomen or pelvis.  Several calcifications are noted in the left renal hilum, the majority of which favored to be vascular.  However, there is at least one 5 mm nonobstructive calculus in the lower pole  collecting system of the left kidney (image 142 of series 2).  Extensive atherosclerosis throughout the abdominal and pelvic vasculature, without definite aneurysm.  Skeleton:  No focal hypermetabolic activity to suggest skeletal metastasis.  Old healed fracture of the right proximal femur with postoperative changes of cerclage fixation incompletely imaged.  IMPRESSION: 1.  5.3 x 4.9 cm pleural based macrolobulated hypermetabolic left upper lobe mass with central necrosis, compatible with the biopsy proven squamous cell carcinoma.  No associated mediastinal or hilar lymphadenopathy, and no definite signs of distal metastatic disease on today's examination.  Findings are compatible with T2b, N0, Mx disease (i.e., likely stage IIA). 2. 5 mm nonobstructive calculus in the lower pole collecting system of the left kidney. 3. Atherosclerosis, including left main and three-vessel coronary artery disease.  Assessment for potential risk factor modification, dietary therapy or pharmacologic therapy may be warranted, if clinically indicated. 4.  Additional incidental findings, as above.  Original Report Authenticated By: Trudie Reed, M.D.    Ct Biopsy  07/31/2012  *RADIOLOGY REPORT*  Clinical history:79-year male with a left lung mass.  PROCEDURE(S): CT GUIDED BIOPSY OF LEFT LUNG MASS  Physician: Rachelle Hora. Henn, MD  Medications:Versed 1 mg, Fentanyl 50 mcg. A radiology nurse monitored the patient for moderate sedation.  Moderate sedation time:21 minutes  Fluoroscopy time: 16 seconds CT fluoroscopy.  Procedure:The procedure was explained to the patient.  The risks and benefits of the procedure were discussed and the patient's questions were addressed.  Informed consent was obtained from the patient. The patient was placed with the left side slightly down. Images through the upper chest were obtained.  The left anterior chest was prepped and draped in a sterile fashion.  The skin was anesthetized with lidocaine.  A 17 gauge  needle was directed into the left upper lobe mass with CT fluoroscopy.  Needle placement confirmed within the lesion.  Three core biopsies were obtained with an 18 gauge device.  The specimens were placed in formalin. The 17 gauge needle was removed without complication.  Findings:5 cm mass in the medial left upper lobe.  Needle placement was confirmed along the lateral aspect of the lesion.  No pneumothorax following needle removal.  Complications: None  Impression:CT guided core biopsies of the left upper lobe mass.   Original Report Authenticated By: Richarda Overlie, M.D.       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  He is not felt to be an ideal surgical candidate and may benefit from localized radiotherapy with or without concurrent chemotherapy. He also happens to have a suspected pulmonary embolism requiring further evaluation in the emergency department.  PLAN:Today, I talked to the patient and family about the findings and work-up thus far.  We discussed the natural history of non-small cell lung cancer and general treatment, highlighting the role or  radiotherapy in the management.  We discussed the available radiation techniques, and focused on the details of logistics and delivery.  We reviewed the anticipated acute and late sequelae associated with radiation in this setting.  The patient was encouraged to ask questions that I answered to the best of my ability.   The patient would like to proceed with radiation and will be scheduled for CT simulation sometime next week.  At this point, the patient was advised to proceed with emergency Department for further evaluation for potential pulmonary embolism.  I spent 60 minutes minutes face to face with the patient and more than 50% of that time was spent in counseling and/or coordination of care.   ------------------------------------------------  Artist Pais. Kathrynn Running, M.D.

## 2012-08-28 NOTE — Progress Notes (Signed)
   Thoracic Treatment Summary Name: Tejuan Gholson Date: 08/28/12 DOB: 09-Sep-2032 Your Medical Team Medical Oncologist: Radiation Oncologist: Dr. Kathrynn Running Pulmonologist: Surgeon: Type and Stage of Lung Cancer Non-Small Cell Carcinoma:  Squamous Cell Clinical Stage:  II Pathological Stage:  Clinical stage is based on radiology exams.  Pathological stage will be determined after surgery.  Staging is based on the size of the tumor, involvement of lymph nodes or not, and whether or not the cancer center has spread. Recommendations Recommendations: Radiation therapy  These recommendations are based on information available as of today's consult.  This is subject to change depending further testing or exams. Next Steps Next Step: 1. Cancer Center will call with appointment for radiation  Barriers to Care What do you perceive as a potential barrier that may prevent you from receiving your treatment plan? Patient does not perceive any barriers to care at this time.  Information on lung cancer and resources given and explained Questions Willette Pa, RN BSN Thoracic Oncology Nurse Navigator at [phone]  Annabelle Harman is a nurse navigator that is available to assist you through your cancer journey.  She can answer your questions and/or provide resources regarding your treatment plan, emotional support, or financial concerns.

## 2012-08-28 NOTE — Telephone Encounter (Signed)
Called pt regarding appt for [facility] 08/28/12 no answer on home or mobile numbers

## 2012-08-28 NOTE — ED Notes (Signed)
Pt sent here by PCP, with c/o of possible PE. NAD at this time.

## 2012-08-29 ENCOUNTER — Emergency Department (HOSPITAL_COMMUNITY): Payer: Medicare Other

## 2012-08-29 ENCOUNTER — Other Ambulatory Visit: Payer: Self-pay | Admitting: *Deleted

## 2012-08-29 ENCOUNTER — Emergency Department (HOSPITAL_COMMUNITY)
Admission: EM | Admit: 2012-08-29 | Discharge: 2012-08-30 | Disposition: A | Discharge status: Home / Self Care | Payer: Medicare Other | Attending: Emergency Medicine | Admitting: Emergency Medicine

## 2012-08-29 ENCOUNTER — Encounter: Payer: Self-pay | Admitting: *Deleted

## 2012-08-29 ENCOUNTER — Encounter (HOSPITAL_COMMUNITY): Payer: Self-pay | Admitting: *Deleted

## 2012-08-29 ENCOUNTER — Telehealth: Payer: Self-pay | Admitting: *Deleted

## 2012-08-29 DIAGNOSIS — Z79899 Other long term (current) drug therapy: Secondary | ICD-10-CM | POA: Insufficient documentation

## 2012-08-29 DIAGNOSIS — I2699 Other pulmonary embolism without acute cor pulmonale: Secondary | ICD-10-CM

## 2012-08-29 DIAGNOSIS — C3490 Malignant neoplasm of unspecified part of unspecified bronchus or lung: Secondary | ICD-10-CM | POA: Insufficient documentation

## 2012-08-29 DIAGNOSIS — I10 Essential (primary) hypertension: Secondary | ICD-10-CM | POA: Insufficient documentation

## 2012-08-29 DIAGNOSIS — G309 Alzheimer's disease, unspecified: Secondary | ICD-10-CM | POA: Insufficient documentation

## 2012-08-29 DIAGNOSIS — R918 Other nonspecific abnormal finding of lung field: Secondary | ICD-10-CM

## 2012-08-29 DIAGNOSIS — F0280 Dementia in other diseases classified elsewhere without behavioral disturbance: Secondary | ICD-10-CM | POA: Insufficient documentation

## 2012-08-29 DIAGNOSIS — Z8673 Personal history of transient ischemic attack (TIA), and cerebral infarction without residual deficits: Secondary | ICD-10-CM | POA: Insufficient documentation

## 2012-08-29 LAB — POCT I-STAT, CHEM 8
Calcium, Ion: 1.2 mmol/L (ref 1.13–1.30)
Glucose, Bld: 93 mg/dL (ref 70–99)
HCT: 35 % — ABNORMAL LOW (ref 39.0–52.0)
Hemoglobin: 11.9 g/dL — ABNORMAL LOW (ref 13.0–17.0)
Potassium: 4.3 mEq/L (ref 3.5–5.1)

## 2012-08-29 MED ORDER — GADOBENATE DIMEGLUMINE 529 MG/ML IV SOLN
14.0000 mL | Freq: Once | INTRAVENOUS | Status: AC | PRN
  Administered 2012-08-29: 14 mL via INTRAVENOUS

## 2012-08-29 NOTE — Telephone Encounter (Signed)
Spoke with daughter regarding appt for MRI brain (located at [facility]) and RT.  She verbalized understanding.

## 2012-08-29 NOTE — ED Notes (Signed)
Bed:WA21<BR> Expected date:<BR> Expected time:<BR> Means of arrival:<BR> Comments:<BR> fall

## 2012-08-29 NOTE — ED Notes (Signed)
William Walters, daughter [phone] cellphone

## 2012-08-29 NOTE — Telephone Encounter (Signed)
Left VM message regarding appt for [facility] 09/05/12 at 3:00 arrive at 2:30

## 2012-08-29 NOTE — ED Notes (Signed)
Patient transported to MRI 

## 2012-08-29 NOTE — ED Notes (Signed)
Per daughter-- pt has lung ca and was seen here recently and told he has a "possible blood clot in his lung." Pts MD told him to come to ER tonight to have MRI of brain to make sure "there was no bleeding in his brain before they start him on blood thinning medication."

## 2012-08-29 NOTE — ED Provider Notes (Signed)
History     CSN: [id number]  Arrival date & time 08/29/12  1911   First MD Initiated Contact with Patient 08/29/12 1913       chief complaint: Needs MRI   The history is provided by the patient, medical records and a relative.   the patient was ordered to have an MRI of his brain to evaluate for metastatic cancer today.  This was scheduled to be done at an outpatient facility.  It's unclear to me why the patient showed up in the emergency department.  The patient has no complaints.  He was seen emergency room yesterday and diagnosed with a pulmonary embolism and was started on Xarelto.  It sounds though his oncologist wanted to evaluate for possible brain metastases before continuing anticoagulation.  The patient has no complaints at this time.  Past Medical History  Diagnosis Date  . Hypertension   . Stroke   . Alzheimer disease     History reviewed. No pertinent past surgical history.  History reviewed. No pertinent family history.  History  Substance Use Topics  . Smoking status: Not on file  . Smokeless tobacco: Not on file  . Alcohol Use: Not on file      Review of Systems  All other systems reviewed and are negative.    Allergies  Review of patient's allergies indicates no known allergies.  Home Medications   Current Outpatient Rx  Name  Route  Sig  Dispense  Refill  . captopril (CAPOTEN) 50 MG tablet   Oral   Take 100 mg by mouth 2 (two) times daily.         . hydrochlorothiazide (HYDRODIURIL) 25 MG tablet   Oral   Take 25 mg by mouth daily.         . Rivaroxaban (XARELTO) 15 MG TABS tablet   Oral   Take 1 tablet (15 mg total) by mouth 2 (two) times daily.   42 tablet   0   . sulfamethoxazole-trimethoprim (BACTRIM DS) 800-160 MG per tablet   Oral   Take 1 tablet by mouth 2 (two) times daily.           BP 142/69  Pulse 76  Temp(Src) 97.7 F (36.5 C) (Oral)  Resp 20  Physical Exam  Nursing note and vitals reviewed. Constitutional: He  is oriented to person, place, and time. He appears well-developed and well-nourished.  HENT:  Head: Normocephalic.  Eyes: EOM are normal.  Neck: Normal range of motion.  Pulmonary/Chest: Effort normal.  Abdominal: He exhibits no distension.  Musculoskeletal: Normal range of motion.  Neurological: He is alert and oriented to person, place, and time.  Psychiatric: He has a normal mood and affect.    ED Course  Procedures (including critical care time)  Labs Reviewed - No data to display Dg Chest 2 View  08/28/2012  *RADIOLOGY REPORT*  Clinical Data: Shortness of breath.  Weakness.Lung mass.  CHEST - 2 VIEW  Comparison: Multiple exams, including 08/26/2012  Findings: Emphysema noted along with a left upper lobe mass similar in size to recent prior examinations.  There is subsegmental atelectasis adjacent to the right hemidiaphragm.  Heart size within normal limits for technique.  No acute thoracic findings are observed.  IMPRESSION:  1.  Essentially stable left upper lobe mass. 2.  Subsegmental atelectasis along the right hemidiaphragm.   Original Report Authenticated By: Gaylyn Rong, M.D.    Ct Angio Chest Pe W/cm &/or Wo Cm  08/28/2012  *RADIOLOGY REPORT*  Clinical Data: Pulmonary embolism.  Lung cancer.  Pulmonary embolism on prior CT.  CT ANGIOGRAPHY CHEST  Technique:  Multidetector CT imaging of the chest using the standard protocol during bolus administration of intravenous contrast. Multiplanar reconstructed images including MIPs were obtained and reviewed to evaluate the vascular anatomy.  Contrast: OMNIPAQUE IOHEXOL 350 MG/ML SOLN  Comparison: 07/18/2012.  Findings: Technically adequate study. Tiny segmental and smaller pulmonary emboli are identified.  Represent of embolus is present in the right upper lobe pulmonary artery (image number 39 series 4).  Left lower lobe pulmonary embolus is also present (image 67 series 5).  There is no right heart strain.  Incidental imaging the  upper abdomen demonstrates a tiny low density lesion in the right hepatic lobe probably representing a cyst that appears unchanged compared to prior.  Other smaller lesions are also present, stable compared to prior.  Old granulomatous disease of the spleen.  The spleen is diminutive.  No convincing evidence of an adrenal mass.  Mild thickening of the right adrenal gland appears stable.  The left upper lobe mass abutting the apex and left mediastinal surface as well as left anterior pleural surface has enlarged compared to the presenting exam of 07/18/2012.  On axial imaging, the mass now measures 57 mm AP by 46 mm transverse (previously 49 mm x 44 mm).  Craniocaudal extent remains the same, with extension from the left hilum to the left apex.  This mass has encased and occluded the left upper lobe pulmonary artery with the stump of the left upper lobe pulmonary artery visible on image number 36.  Presumably, the lung is being fed from collateral flow from the bronchial arteries.  Emphysema is present.  Dependent atelectasis.  Thoracic spondylosis and exaggerated kyphosis.  No aggressive osseous lesions. Aortic arch atherosclerosis. Old granulomatous calcifications in the mediastinal lymph nodes appears similar.  No discrete adenopathy.  IMPRESSION: 1. Positive for pulmonary embolus.  Segmental bilateral pulmonary emboli are present without right heart strain. Probable chronic pulmonary emboli in the descending right pulmonary artery.  No acute aortic abnormality. 2.  Enlarging left upper lobe mass encasing and occluding the left upper lobe pulmonary artery, today measuring 57 mm x 46 mm on axial imaging. 3.  Emphysema and basilar atelectasis.  Critical Value/emergent results were called by telephone at the time of interpretation on 08/28/2012. at 2021 hours to Dr.RANCOUR, who verbally acknowledged these results.   Original Report Authenticated By: Andreas Newport, M.D.    Mr Laqueta Jean ZO Contrast  08/29/2012   *RADIOLOGY REPORT*  Clinical Data: Lung mass.  Question intracranial metastatic disease.  MRI HEAD WITHOUT AND WITH CONTRAST  Technique:  Multiplanar, multiecho pulse sequences of the brain and surrounding structures were obtained according to standard protocol without and with intravenous contrast  Contrast: 14mL MULTIHANCE GADOBENATE DIMEGLUMINE 529 MG/ML IV SOLN  Comparison: None.  Findings: No intracranial enhancing lesion or bony destructive lesion to suggest the presence of intracranial metastatic disease.  No acute infarct.  No intracranial hemorrhage.  Remote left corona radiata and basal ganglia infarcts.  Small vessel disease type changes.  Global atrophy without hydrocephalus.  Major intracranial vascular structures are patent.  Mucosal thickening paranasal sinuses with polypoid appearance inferior aspect left maxillary sinus.  Mild cervical spondylotic changes C3-4.  Cervical medullary junction, pituitary region, pineal region and orbital structures unremarkable.  IMPRESSION: No evidence of intracranial metastatic disease.  Please see above.   Original Report Authenticated By: Lacy Duverney, M.D.    I  personally reviewed the imaging tests through PACS system I reviewed available ER/hospitalization records through the EMR   1. Pulmonary embolism       MDM  I will order the patient's MRI with and without contrast.  The patient is a symptoms at this time.   12:09 AM The patient will start his anticoagulation.  No brain metastases noted on MRI.       Lyanne Co, MD 08/30/12 [phone]

## 2012-08-30 NOTE — ED Notes (Signed)
Pt returned to room from MRI, MD notified, will attempt to contact family at this time

## 2012-09-03 ENCOUNTER — Telehealth: Payer: Self-pay | Admitting: *Deleted

## 2012-09-03 ENCOUNTER — Telehealth: Payer: Self-pay | Admitting: Internal Medicine

## 2012-09-03 NOTE — Telephone Encounter (Signed)
I received notification pt daughter was questioning MRI scan.  I left message the reason for scan and to call with any further questions

## 2012-09-03 NOTE — Telephone Encounter (Signed)
S/W THE PT'S DAUGHTER REGARDING THE PT NEEDING  LAB APPT TO BE DONE PRIOR TO THE MRI APPT. THE PT'S DTR REQUESTED TO HAVE HER MRI DONE AT [facility] DUE TO THE PT IS NON AMBULATORY. AND SHE HAS TO BE ABLE TO GET [facility]=IM UP FOR THE APPTS. S/W DANA HERNDON AND SHE STATED THE REASON WHY THE PT COULD NOT HAS HIS MR I DONE AT [facility] DUE TO HE NEEDS THE THREE T MACHINE. PER DANA SHE HAS LEFT HER PHONE NUMBER WITH THE PT'S DTR AND ASKED THAT THEY CALL HER WITH ANY QUESTIONS. PER PT'S DTR SHE WILL CALL DANA AND HAVE THE APPT CHANGED FROM 09/04/2012.

## 2012-09-04 ENCOUNTER — Other Ambulatory Visit: Payer: Medicare Other

## 2012-09-05 ENCOUNTER — Other Ambulatory Visit: Payer: Self-pay | Admitting: Radiation Oncology

## 2012-09-08 ENCOUNTER — Encounter: Payer: Self-pay | Admitting: Vascular Surgery

## 2012-09-09 ENCOUNTER — Encounter: Payer: Medicare Other | Admitting: Vascular Surgery

## 2012-09-09 ENCOUNTER — Other Ambulatory Visit: Payer: Self-pay | Admitting: *Deleted

## 2012-09-09 DIAGNOSIS — L97909 Non-pressure chronic ulcer of unspecified part of unspecified lower leg with unspecified severity: Secondary | ICD-10-CM

## 2012-09-11 ENCOUNTER — Ambulatory Visit
Admission: RE | Admit: 2012-09-11 | Discharge: 2012-09-11 | Disposition: A | Discharge status: Home / Self Care | Payer: Medicare Other | Source: Ambulatory Visit | Attending: Radiation Oncology | Admitting: Radiation Oncology

## 2012-09-11 DIAGNOSIS — Z510 Encounter for antineoplastic radiation therapy: Secondary | ICD-10-CM | POA: Insufficient documentation

## 2012-09-11 DIAGNOSIS — C3410 Malignant neoplasm of upper lobe, unspecified bronchus or lung: Secondary | ICD-10-CM | POA: Insufficient documentation

## 2012-09-11 DIAGNOSIS — R918 Other nonspecific abnormal finding of lung field: Secondary | ICD-10-CM

## 2012-09-11 NOTE — Progress Notes (Signed)
  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  Identity was confirmed.  All relevant records and images related to the planned course of therapy were reviewed.  The patient freely provided informed written consent to proceed with treatment after reviewing the details related to the planned course of therapy. The consent form was witnessed and verified by the simulation staff.  Then, the patient was set-up in a stable reproducible  supine position for radiation therapy.  CT images were obtained.  Surface markings were placed.  The CT images were loaded into the planning software.  Then the target and avoidance structures were contoured.  Treatment planning then occurred.  The radiation prescription was entered and confirmed.  Then, I designed and supervised the construction of a total of 5 medically necessary complex treatment devices.  I have requested : 3D Simulation  I have requested a DVH of the following structures: Spinal cord heart lungs target.  I have ordered:Nutrition Consult  PLAN:  The patient will receive 66 Gy in 33 fraction.  ________________________________  Artist Pais Kathrynn Running, M.D.im

## 2012-09-18 ENCOUNTER — Telehealth: Payer: Self-pay

## 2012-09-18 ENCOUNTER — Ambulatory Visit
Admission: RE | Admit: 2012-09-18 | Discharge: 2012-09-18 | Disposition: A | Discharge status: Home / Self Care | Payer: Medicare Other | Source: Ambulatory Visit | Attending: Radiation Oncology | Admitting: Radiation Oncology

## 2012-09-18 DIAGNOSIS — R918 Other nonspecific abnormal finding of lung field: Secondary | ICD-10-CM

## 2012-09-18 NOTE — Progress Notes (Signed)
  Radiation Oncology         [phone]) [phone] ________________________________  Name: William Walters MRN: [id number]  Date: 09/18/2012  DOB: Sep 05, 1932  Simulation Verification Note  Status: outpatient  NARRATIVE: The patient was brought to the treatment unit and placed in the planned treatment position. The clinical setup was verified. Then port films were obtained and uploaded to the radiation oncology medical record software.  The treatment beams were carefully compared against the planned radiation fields. The position location and shape of the radiation fields was reviewed. They targeted volume of tissue appears to be appropriately covered by the radiation beams. Organs at risk appear to be excluded as planned.  Based on my personal review, I approved the simulation verification. The patient's treatment will proceed as planned.  ------------------------------------------------  Artist Pais Kathrynn Running, M.D.

## 2012-09-18 NOTE — Telephone Encounter (Signed)
Spoke with daughter Ines Bloomer , dad will be picked up by [facility] [employer] at 1:30 pm.Transportation thru medicaid DSS [phone] at front desk informed.

## 2012-09-19 ENCOUNTER — Ambulatory Visit
Admission: RE | Admit: 2012-09-19 | Discharge: 2012-09-19 | Disposition: A | Discharge status: Home / Self Care | Payer: Medicare Other | Source: Ambulatory Visit | Attending: Radiation Oncology | Admitting: Radiation Oncology

## 2012-09-19 ENCOUNTER — Ambulatory Visit: Payer: Medicare Other

## 2012-09-19 ENCOUNTER — Encounter: Payer: Self-pay | Admitting: Radiation Oncology

## 2012-09-19 VITALS — BP 132/60 | HR 72 | Resp 18 | Wt 142.0 lb

## 2012-09-19 DIAGNOSIS — R918 Other nonspecific abnormal finding of lung field: Secondary | ICD-10-CM

## 2012-09-19 MED ORDER — RADIAPLEXRX EX GEL
Freq: Once | CUTANEOUS | Status: AC
Start: 2012-09-19 — End: 2012-09-19
  Administered 2012-09-19: 15:00:00 via TOPICAL

## 2012-09-19 NOTE — Progress Notes (Addendum)
Patient presented to the clinic today following initial treatment for PUT with Dr. Kathrynn Running. Patient alert and oriented to person, place, and time. No distress noted. Patient riding in motorized wheelchair but, able to stand for weight. Pleasant affect noted. Patient reports pain in his right foot from recent surgery. Patient denies cough or shortness of breath. Patient reports occasional difficult swallowing but, denies pain. Provided patient with radiaplex and directed upon use. Provided patient with [facility] handbook, then reviewed pertinent information. Encouraged patient to share this handbook with his daughter. Patient verbalized understanding. Reported all findings to Dr. Kathrynn Running.

## 2012-09-20 ENCOUNTER — Encounter (HOSPITAL_COMMUNITY): Payer: Self-pay | Admitting: Family Medicine

## 2012-09-20 ENCOUNTER — Observation Stay (HOSPITAL_COMMUNITY)
Admission: EM | Admit: 2012-09-20 | Discharge: 2012-09-21 | Disposition: A | Discharge status: Home / Self Care | Payer: Medicare Other | Attending: Internal Medicine | Admitting: Internal Medicine

## 2012-09-20 ENCOUNTER — Emergency Department (HOSPITAL_COMMUNITY): Payer: Medicare Other

## 2012-09-20 DIAGNOSIS — T148XXA Other injury of unspecified body region, initial encounter: Secondary | ICD-10-CM

## 2012-09-20 DIAGNOSIS — IMO0002 Reserved for concepts with insufficient information to code with codable children: Secondary | ICD-10-CM

## 2012-09-20 DIAGNOSIS — M79609 Pain in unspecified limb: Secondary | ICD-10-CM | POA: Insufficient documentation

## 2012-09-20 DIAGNOSIS — Y92009 Unspecified place in unspecified non-institutional (private) residence as the place of occurrence of the external cause: Secondary | ICD-10-CM

## 2012-09-20 DIAGNOSIS — G309 Alzheimer's disease, unspecified: Secondary | ICD-10-CM | POA: Insufficient documentation

## 2012-09-20 DIAGNOSIS — W010XXA Fall on same level from slipping, tripping and stumbling without subsequent striking against object, initial encounter: Secondary | ICD-10-CM | POA: Insufficient documentation

## 2012-09-20 DIAGNOSIS — R55 Syncope and collapse: Secondary | ICD-10-CM | POA: Insufficient documentation

## 2012-09-20 DIAGNOSIS — I10 Essential (primary) hypertension: Secondary | ICD-10-CM | POA: Insufficient documentation

## 2012-09-20 DIAGNOSIS — F0280 Dementia in other diseases classified elsewhere without behavioral disturbance: Secondary | ICD-10-CM | POA: Insufficient documentation

## 2012-09-20 DIAGNOSIS — I2699 Other pulmonary embolism without acute cor pulmonale: Secondary | ICD-10-CM | POA: Diagnosis present

## 2012-09-20 DIAGNOSIS — Z85118 Personal history of other malignant neoplasm of bronchus and lung: Secondary | ICD-10-CM | POA: Insufficient documentation

## 2012-09-20 DIAGNOSIS — W19XXXD Unspecified fall, subsequent encounter: Secondary | ICD-10-CM

## 2012-09-20 DIAGNOSIS — S0100XA Unspecified open wound of scalp, initial encounter: Principal | ICD-10-CM | POA: Insufficient documentation

## 2012-09-20 DIAGNOSIS — I69969 Other paralytic syndrome following unspecified cerebrovascular disease affecting unspecified side: Secondary | ICD-10-CM | POA: Insufficient documentation

## 2012-09-20 DIAGNOSIS — Z7901 Long term (current) use of anticoagulants: Secondary | ICD-10-CM | POA: Insufficient documentation

## 2012-09-20 DIAGNOSIS — F0150 Vascular dementia without behavioral disturbance: Secondary | ICD-10-CM | POA: Diagnosis present

## 2012-09-20 DIAGNOSIS — W19XXXA Unspecified fall, initial encounter: Secondary | ICD-10-CM

## 2012-09-20 DIAGNOSIS — G9389 Other specified disorders of brain: Secondary | ICD-10-CM | POA: Insufficient documentation

## 2012-09-20 DIAGNOSIS — R404 Transient alteration of awareness: Secondary | ICD-10-CM | POA: Insufficient documentation

## 2012-09-20 DIAGNOSIS — C3490 Malignant neoplasm of unspecified part of unspecified bronchus or lung: Secondary | ICD-10-CM | POA: Diagnosis present

## 2012-09-20 DIAGNOSIS — I619 Nontraumatic intracerebral hemorrhage, unspecified: Secondary | ICD-10-CM

## 2012-09-20 HISTORY — DX: Malignant (primary) neoplasm, unspecified: C80.1

## 2012-09-20 LAB — CBC WITH DIFFERENTIAL/PLATELET
Basophils Absolute: 0.1 10*3/uL (ref 0.0–0.1)
Basophils Relative: 1 % (ref 0–1)
Hemoglobin: 11.5 g/dL — ABNORMAL LOW (ref 13.0–17.0)
MCHC: 34.7 g/dL (ref 30.0–36.0)
Neutro Abs: 7.5 10*3/uL (ref 1.7–7.7)
Neutrophils Relative %: 72 % (ref 43–77)
RDW: 15.5 % (ref 11.5–15.5)

## 2012-09-20 LAB — APTT: aPTT: 44 seconds — ABNORMAL HIGH (ref 24–37)

## 2012-09-20 LAB — PROTIME-INR: Prothrombin Time: 22.6 seconds — ABNORMAL HIGH (ref 11.6–15.2)

## 2012-09-20 LAB — BASIC METABOLIC PANEL
Chloride: 98 mEq/L (ref 96–112)
GFR calc Af Amer: 64 mL/min — ABNORMAL LOW (ref >90)
Potassium: 4.3 mEq/L (ref 3.5–5.1)
Sodium: 131 mEq/L — ABNORMAL LOW (ref 135–145)

## 2012-09-20 MED ORDER — ACETAMINOPHEN-CODEINE #3 300-30 MG PO TABS: 1.0000 | ORAL_TABLET | Freq: Four times a day (QID) | ORAL | Status: DC | PRN

## 2012-09-20 MED ORDER — TETANUS-DIPHTH-ACELL PERTUSSIS 5-2.5-18.5 LF-MCG/0.5 IM SUSP
0.5000 mL | Freq: Once | INTRAMUSCULAR | Status: AC
  Administered 2012-09-20: 0.5 mL via INTRAMUSCULAR
  Filled 2012-09-20: qty 0.5

## 2012-09-20 NOTE — ED Notes (Signed)
Dr. Rhunette Croft applied suture and dressing to pt. Pt tolerated well.

## 2012-09-20 NOTE — ED Notes (Signed)
Pt's daughter requesting case management to assist with obtaining home health nurse.

## 2012-09-20 NOTE — ED Notes (Signed)
Pt cleared from LSB and head blocks. Pt denies pain to back or neck on palpation. Pt continues to mentate well

## 2012-09-20 NOTE — ED Notes (Signed)
Per Case Manager, Raynelle Fanning, no action is able to be taken at this time by Case Manager or Social Work at [facility]. Per CM, EDP can provide script for "Personal Care Services". Dr. Rhunette Croft has written script for pt.

## 2012-09-20 NOTE — ED Notes (Signed)
Lab and Radiology at bedside. Pt remains alert and interactive. Family remains at bedside.

## 2012-09-20 NOTE — ED Notes (Signed)
Suture cart at bedside 

## 2012-09-20 NOTE — ED Notes (Signed)
Per EMS pt is from home and had witnessed fall from standing position to hardwood floor. Pt has right-sided paralysis from previous stroke. Per Family, pt continued to "move" after fall but pt had eyes closed and did not respond for 1-2 minutes. Per EMS pt oriented to self and pace, not oriented to time/situation. Pt does not remember falling. Pt presents with 1cm laceration to right forehead, pupils PERRLA, following commands on left side. Pt alert and interactive. NKA, 18g LAC. Hx of HTN, Lung CA, and Stroke

## 2012-09-20 NOTE — ED Provider Notes (Addendum)
History     CSN: [id number]  Arrival date & time 09/20/12  2000   First MD Initiated Contact with Patient 09/20/12 2017      Chief Complaint  Patient presents with  . Loss of Consciousness  . Fall    (Consider location/radiation/quality/duration/timing/severity/associated sxs/prior treatment) HPI Comments: Pt on coumadin comes in with cc of fall. Pt has hx of strokes. Pt has hx of falls, and had a mechanical fall witnessed by family. Pt reports no headaches,  nausea, vomiting, visual complains, seizures, altered mental status, loss of consciousness, new weakness, or numbness. He has right sided paralysis due to the stroke. Unsure tetanus status. Pt has no pain over any of his extremities, chest or abdomen. Pt denies any prodromal type sx prior to the fall.   Patient is a 79 y.o. male presenting with syncope and fall. The history is provided by the patient.  Loss of Consciousness Associated symptoms: no chest pain, no dizziness and no shortness of breath   Fall Pertinent negatives include no chest pain, no abdominal pain and no shortness of breath.    Past Medical History  Diagnosis Date  . Hypertension   . Stroke   . Alzheimer disease   . Cancer     Lung    History reviewed. No pertinent past surgical history.  No family history on file.  History  Substance Use Topics  . Smoking status: Former Games developer  . Smokeless tobacco: Never Used  . Alcohol Use: No      Review of Systems  Constitutional: Negative for activity change and appetite change.  Respiratory: Negative for cough and shortness of breath.   Cardiovascular: Positive for syncope. Negative for chest pain.  Gastrointestinal: Negative for abdominal pain.  Genitourinary: Negative for dysuria.  Neurological: Negative for dizziness.  Hematological: Bruises/bleeds easily.    Allergies  Review of patient's allergies indicates no known allergies.  Home Medications   Current Outpatient Rx  Name  Route  Sig   Dispense  Refill  . captopril (CAPOTEN) 50 MG tablet   Oral   Take 100 mg by mouth 2 (two) times daily.         . hydrochlorothiazide (HYDRODIURIL) 25 MG tablet   Oral   Take 25 mg by mouth daily.           BP 149/79  Pulse 92  Temp(Src) 98.4 F (36.9 C) (Oral)  Resp 20  SpO2 96%  Physical Exam  Nursing note and vitals reviewed. Constitutional: He is oriented to person, place, and time. He appears well-developed.  HENT:  Head: Normocephalic and atraumatic.  No midline c-spine tenderness, pt able to turn head to 45 degrees bilaterally without any pain and able to flex neck to the chest and extend without any pain or neurologic symptoms.   Eyes: Conjunctivae and EOM are normal. Pupils are equal, round, and reactive to light.  Neck: Normal range of motion. Neck supple.  Cardiovascular: Normal rate and regular rhythm.   Pulmonary/Chest: Effort normal and breath sounds normal.  Abdominal: Soft. Bowel sounds are normal. He exhibits no distension. There is no tenderness. There is no rebound and no guarding.  Musculoskeletal:  Pt has a 4 cm laceration lateral to the right eye brow.  Head to toe evaluation shows no hematoma, bleeding of the scalp, no facial abrasions, step offs, crepitus, no tenderness to palpation of the bilateral upper and lower extremities, no gross deformities, no chest tenderness, no pelvic pain.   Neurological: He is alert  and oriented to person, place, and time.  Skin: Skin is warm.    ED Course  LACERATION REPAIR Date/Time: 09/20/2012 10:55 PM Performed by: Derwood Kaplan Authorized by: Derwood Kaplan Consent: Verbal consent obtained. Risks and benefits: risks, benefits and alternatives were discussed Consent given by: patient Patient understanding: patient states understanding of the procedure being performed Patient identity confirmed: verbally with patient Time out: Immediately prior to procedure a "time out" was called to verify the correct  patient, procedure, equipment, support staff and site/side marked as required. Body area: head/neck Location details: forehead Laceration length: 5 cm Foreign bodies: no foreign bodies Tendon involvement: none Nerve involvement: none Vascular damage: no Anesthesia: local infiltration Local anesthetic: lidocaine 1% with epinephrine Anesthetic total: 3 ml Patient sedated: no Preparation: Patient was prepped and draped in the usual sterile fashion. Irrigation solution: tap water Irrigation method: syringe Amount of cleaning: standard Debridement: none Degree of undermining: none Number of sutures: 6 Technique: simple Approximation: close Approximation difficulty: simple Dressing: 4x4 sterile gauze and antibiotic ointment Patient tolerance: Patient tolerated the procedure well with no immediate complications.   (including critical care time)  Labs Reviewed  CBC WITH DIFFERENTIAL - Abnormal; Notable for the following:    RBC 3.81 (*)    Hemoglobin 11.5 (*)    HCT 33.1 (*)    All other components within normal limits  BASIC METABOLIC PANEL  PROTIME-INR  APTT   Dg Pelvis Portable  09/20/2012   *RADIOLOGY REPORT*  Clinical Data: Fall, leg pain, history of lung cancer  PORTABLE PELVIS  Comparison: PET CT 08/26/2012  Findings: Bony remodeling of the right proximal femur with cerclage wires partly visualized, likely indicating fixation of remote fracture.  Severe right hip degenerative change is noted.  Right inguinal clips are present.  Vascular calcifications are noted. Lumbar spine degenerative change identified.  No displaced pelvic fracture or visualized proximal femoral fracture.  IMPRESSION: No acute pelvic fracture.   Original Report Authenticated By: Christiana Pellant, M.D.     No diagnosis found.    MDM  DDx includes: - Mechanical falls - ICH - Fractures - Contusions - Soft tissue injury  Pt comes in with cc of fall. Pt has a cut that will be repaired. PT also has  will get CT head as he is on coumadin and pelvic Xray. Head to toe survey was WNL. Tetanus to be updated.  Derwood Kaplan, MD 09/20/12 2151  10:56 PM Pt's CT shows possible SAH. Pt reassessed and he continued to deny any nausea, vomiting, visual complains, seizures, altered mental status, loss of consciousness, new weakness, or numbness. Spoke with Dr. Yetta Barre, [place], and he states that he was not too impressed with the CT findings in first place, but in light of completely benign hx and non focal neuro exam, he thinks patient can be: 1 - watched by family at home closely for a period of 24 hours and brought to the ER immediately if there is any change in mentation. 2 - If family not available, or uncomfortable, patient stay overnight.  Patient lives with daughter, and the daughter feels comfortable with watching and checking her father every 2-4 hours. She is not working tomorrow, so she can check on him until Monday.  Will discharge. Return precautions discussed.   Derwood Kaplan, MD 09/20/12 2344  12:15 AM Pt's daughters have changed their mind. They dont feel comfortable taking them home. Will admit as obs.  Derwood Kaplan, MD 09/21/12 [id number]

## 2012-09-20 NOTE — Progress Notes (Signed)
Patient ID: William Walters, male   DOB: 04/02/1933, 79 y.o.   MRN: [id number] Called to review CT scan on elderly gentleman on coumadin who fell 3 hours ago.  No LOC, EDP states he is neurologically intact w/o headache.  CT scan questioned t SAH in R parietal region.  I reviewed the CT scan and there is minimal hyperdensity that potentially could represent a few red blood cells but I feel that even in the face of coumadin this is clinically insignificant.  If he has family that can observe him for 24 hours and call if there are any changes, then I think it is safe for him to be discharged home.  I do not feel that admission with repeat CT scan in the morning is absolutely necessary and recent studies suggest that the yield on this care is quite low and that these repeat CT scans rarely if ever change management decisions.  If EDP not comfortable with the patient's home supervision, that is not unreasonable to have him admitted by the hospitalist overnight for observation.  I'd be happy to help if necessary.

## 2012-09-20 NOTE — ED Notes (Signed)
Case Manager spoke to pt's daughter on phone.

## 2012-09-21 ENCOUNTER — Encounter (HOSPITAL_COMMUNITY): Payer: Self-pay | Admitting: Internal Medicine

## 2012-09-21 DIAGNOSIS — Y92009 Unspecified place in unspecified non-institutional (private) residence as the place of occurrence of the external cause: Secondary | ICD-10-CM

## 2012-09-21 DIAGNOSIS — T148XXA Other injury of unspecified body region, initial encounter: Secondary | ICD-10-CM

## 2012-09-21 DIAGNOSIS — I10 Essential (primary) hypertension: Secondary | ICD-10-CM

## 2012-09-21 DIAGNOSIS — I619 Nontraumatic intracerebral hemorrhage, unspecified: Secondary | ICD-10-CM

## 2012-09-21 DIAGNOSIS — Z7901 Long term (current) use of anticoagulants: Secondary | ICD-10-CM

## 2012-09-21 DIAGNOSIS — F0280 Dementia in other diseases classified elsewhere without behavioral disturbance: Secondary | ICD-10-CM

## 2012-09-21 DIAGNOSIS — I2699 Other pulmonary embolism without acute cor pulmonale: Secondary | ICD-10-CM

## 2012-09-21 DIAGNOSIS — W19XXXA Unspecified fall, initial encounter: Secondary | ICD-10-CM

## 2012-09-21 MED ORDER — CAPTOPRIL 100 MG PO TABS
100.0000 mg | ORAL_TABLET | Freq: Two times a day (BID) | ORAL | Status: DC
  Administered 2012-09-21 (×2): 100 mg via ORAL
  Filled 2012-09-21 (×3): qty 1

## 2012-09-21 MED ORDER — HYDROCHLOROTHIAZIDE 25 MG PO TABS
25.0000 mg | ORAL_TABLET | Freq: Every day | ORAL | Status: DC
  Administered 2012-09-21: 25 mg via ORAL
  Filled 2012-09-21: qty 1

## 2012-09-21 MED ORDER — ACETAMINOPHEN 325 MG PO TABS: 650.0000 mg | ORAL_TABLET | ORAL | Status: DC | PRN

## 2012-09-21 NOTE — Discharge Summary (Signed)
Physician Discharge Summary  William Walters ZOX:[id number] DOB: 12-18-1932 DOA: 09/20/2012  PCP: Aura Dials, MD  Admit date: 09/20/2012 Discharge date: 09/21/2012  Time spent: >30 minutes  Recommendations for Outpatient Follow-up:  Please reassess risk/benefit of anticoagulation during follow up visit. Needs to be off anticoagulation at least for 5-7 days after small ICH. Recheck BP and adjust BP if needed  Discharge Diagnoses:  Principal Problem:   Fall at home Active Problems:   Alzheimer disease   Lung mass   Pulmonary embolism   Chronic anticoagulation   Discharge Condition: stable and improved. No new neurologic deficit or complaints. AAOx2. Will be discharged home with family care and follow with PCP in 1 week  Diet recommendation: heart healthy diet  Filed Weights   09/21/12 0205  Weight: 64.6 kg (142 lb 6.7 oz)    History of present illness:  79 y.o. male With hx of SCC of the lung, non-operative candidate, hx of Alzheimer disease, HTN, prior CVAs, recent PE on anticoagulation, fell at home. He denied HA, nausea, vomiting, or palpitation. Evaluation in the ER included a head CT which showed a small hyperintensity in the parietal lobe which could be consistent with hemorrhage. Neurosurgery was consulted by EDP and Dr Yetta Barre didn't feel that this was significant. He recommended patient be discharged if family is comfortable watching him. Originally plan was made to discharged him home, but family later didn't feel comfortable doing so. Dr Yetta Barre didn't feel a follow up head CT would be required. Hospitalist was asked to admit him for observation. His INR is 2.0.  He also has a laceration on his right parietal area that received sutures.  Hospital Course:  1-Small parietal ICH: really small and not requiring intervention or repeat CT unless patient presents neurological changes. Case discussed with neurosurgery on call for these rec's. -need to stop anticoagulation for at  least 5-7 days -follow with PCP and that point decide risk/benefits for further anticoagulation given fall risk -avoid NSAID's  2-Lung mass: to be follow by Dr. Kathrynn Running as an outpatient  3-Alzheimer disease: will continue supportive care  4-Pulmonary embolism: patient asymptomatic at this point. Was on coumadin and with plan to start xarelto. Needs to be off anticoagulation for at least 5-7 days. PCP to reevaluate risk/benefits for further anticoagulation after this time.  5-HTN: continue current regimen and low sodium diet  *Rest of medical problems stable and the plan is to continue current medication regimen.   Procedures:  See below for x-ray reports  Consultations:  Neurosurgery (Dr. Lovell Sheehan and Dr. Yetta Barre); curbside consultation by phone  Discharge Exam: Filed Vitals:   09/20/12 2330 09/20/12 2345 09/21/12 0205 09/21/12 0552  BP: 113/93 132/66 134/89 115/54  Pulse: 81 80 87 92  Temp:   97.2 F (36.2 C) 98.6 F (37 C)  TempSrc:   Oral Oral  Resp: 15 13  16   Height:   6\' 1"  (1.854 m)   Weight:   64.6 kg (142 lb 6.7 oz)   SpO2: 98% 96% 94% 94%    General: AAOX2 (baseline for dementia); afebrile, no acute complaints Cardiovascular: S1 and S2, no rubs or gallops Respiratory: good air movement, no wheezing, no crackles Abdomen:soft, NT, ND, positive BS Extremities: no edema or cyanosis Neuro: no new focal deficit.   Discharge Instructions  Discharge Orders   Future Appointments Provider Department Dept Phone   09/23/2012 12:20 PM [facility] [id number] [facility] [phone]   09/24/2012 12:20 PM [facility] [id number] [facility] [phone]   09/25/2012 12:05 PM [facility] [id number] [facility] [phone]   09/26/2012 8:00 AM [facility] [id number] [facility] [phone]   09/29/2012 8:00 AM [facility] [id number] [facility] [phone]   09/30/2012 8:00 AM [facility] [id number] [facility] [phone]   10/01/2012 8:00 AM [facility] [id number] [facility] [phone]   10/02/2012 8:00 AM [facility] [id number] [facility] [phone]   10/03/2012 8:00 AM [facility] [id number] [facility] [phone]   10/06/2012 8:00 AM [facility] [id number] [facility] [phone]   10/07/2012 8:00 AM [facility] [id number] [facility] [phone]   10/08/2012 8:00 AM [facility] [id number] [facility] [phone]   10/09/2012 8:00 AM [facility] [id number] [facility] [phone]   10/10/2012 8:00 AM [facility] [id number] [facility] [phone]   10/13/2012 8:00 AM [facility] [id number] [facility] [phone]   10/14/2012 8:00 AM [facility] [id number] [facility] [phone]   10/15/2012 8:00 AM [facility] [id number] [facility] [phone]   10/16/2012 8:00 AM [facility] [id number] [facility] [phone]   10/17/2012 8:00 AM [facility] [id number] [facility] [phone]   10/17/2012 8:30 AM [facility] [phone]   10/17/2012 9:00 AM [facility] [phone]   10/17/2012 9:30 AM Fransisco Hertz, MD [facility] [phone]   10/20/2012 8:00 AM [facility] [id number] [facility] [phone]   10/21/2012 8:00 AM [facility] [id number] [facility] [phone]   10/22/2012 8:00 AM  [facility] [id number] [facility] [phone]   10/23/2012 8:00 AM [facility] [id number] [facility] [phone]   10/24/2012 8:00 AM [facility] [id number] [facility] [phone]   10/27/2012 8:00 AM [facility] [id number] [facility] [phone]   11/06/2012 9:00 AM Amy Aleatha Borer, PT [facility] [phone]   Future Orders Complete By Expires     Diet - low sodium heart healthy  As directed     Discharge instructions  As directed     Comments:      Keep yourself well hydrated Follow with PCP in 1 week Stop anticoagulation (Xarelto) until you follow with PCP Follow a low sodium diet Take medications as prescribed Please avoid NSAID's (aleve, Aspirin, motrin, advil, excedrin, goody powders, ETC...)        Medication List    TAKE these medications       acetaminophen-codeine 300-30 MG per tablet  Commonly known as:  TYLENOL #3  Take 1-2 tablets by mouth every 6 (six) hours as needed for pain.     captopril 50 MG tablet  Commonly known as:  CAPOTEN  Take 100 mg by mouth 2 (two) times daily.     hydrochlorothiazide 25 MG tablet  Commonly known as:  HYDRODIURIL  Take 25 mg by mouth daily.       No Known Allergies     Follow-up Information   Follow up with BOUSKA,DAVID E, MD. Schedule an appointment as soon as possible for a visit in 1 week.   Contact information:   [address] [place] [address] [phone]        The results of significant diagnostics from this hospitalization (including imaging, microbiology, ancillary and laboratory) are listed below for reference.    Significant Diagnostic Studies: Dg Chest 2 View  08/28/2012   *RADIOLOGY REPORT*  Clinical Data: Shortness of breath.  Weakness.Lung mass.  CHEST - 2 VIEW  Comparison: Multiple exams, including 08/26/2012  Findings:  Emphysema noted along with a left upper lobe mass similar in size to recent prior examinations.  There is subsegmental atelectasis adjacent to the right hemidiaphragm.  Heart size within normal limits for technique.  No acute thoracic findings are observed.  IMPRESSION:  1.  Essentially stable left upper lobe mass. 2.  Subsegmental atelectasis along the right hemidiaphragm.   Original Report Authenticated By: Gaylyn Rong, M.D.   Ct Head Wo Contrast  09/20/2012   *RADIOLOGY REPORT*  Clinical Data: Fall, right frontal scalp laceration  CT HEAD WITHOUT CONTRAST  Technique:  Contiguous axial images were obtained from the base of the skull through the vertex without contrast.  Comparison: MRI 08/29/2012  Findings: Diffuse mild cortical volume loss noted with proportional ventricular prominence.  Periventricular white matter hypodensity noted with more focal left frontal periventricular encephalomalacia.  There is curvilinear hyperdensity coursing along the sulcus at the high right parietal occipital region, for example image 24.  No midline shift.  No mass lesion identified.  Mild pansinusitis.  No skull fracture.  Right periorbital soft tissue laceration, image 5.  IMPRESSION: Right periorbital soft tissue laceration.  High right parietal occipital focal hyperdensity which could be artifactual but could represent subarachnoid hemorrhage in the setting of a fall.  Critical Value/emergent results were called by telephone at the time of interpretation on 09/20/12 at 10:05 p.m. to Felicie Morn, PA, who verbally acknowledged these results.   Original Report Authenticated By: Christiana Pellant, M.D.   Ct Angio Chest Pe W/cm &/or Wo Cm  08/28/2012   *RADIOLOGY REPORT*  Clinical Data: Pulmonary embolism.  Lung cancer.  Pulmonary embolism on prior CT.  CT ANGIOGRAPHY CHEST  Technique:  Multidetector CT imaging of the chest using the standard protocol during bolus administration of intravenous contrast. Multiplanar  reconstructed images including MIPs were obtained and reviewed to evaluate the vascular anatomy.  Contrast: OMNIPAQUE IOHEXOL 350 MG/ML SOLN  Comparison: 07/18/2012.  Findings: Technically adequate study. Tiny segmental and smaller pulmonary emboli are identified.  Represent of embolus is present in the right upper lobe pulmonary artery (image number 39 series 4).  Left lower lobe pulmonary embolus is also present (image 67 series 5).  There is no right heart strain.  Incidental imaging the upper abdomen demonstrates a tiny low density lesion in the right hepatic lobe probably representing a cyst that appears unchanged compared to prior.  Other smaller lesions are also present, stable compared to prior.  Old granulomatous disease of the spleen.  The spleen is diminutive.  No convincing evidence of an adrenal mass.  Mild thickening of the right adrenal gland appears stable.  The left upper lobe mass abutting the apex and left mediastinal surface as well as left anterior pleural surface has enlarged compared to the presenting exam of 07/18/2012.  On axial imaging, the mass now measures 57 mm AP by 46 mm transverse (previously 49 mm x 44 mm).  Craniocaudal extent remains the same, with extension from the left hilum to the left apex.  This mass has encased and occluded the left upper lobe pulmonary artery with the  stump of the left upper lobe pulmonary artery visible on image number 36.  Presumably, the lung is being fed from collateral flow from the bronchial arteries.  Emphysema is present.  Dependent atelectasis.  Thoracic spondylosis and exaggerated kyphosis.  No aggressive osseous lesions. Aortic arch atherosclerosis. Old granulomatous calcifications in the mediastinal lymph nodes appears similar.  No discrete adenopathy.  IMPRESSION: 1. Positive for pulmonary embolus.  Segmental bilateral pulmonary emboli are present without right heart strain. Probable chronic pulmonary emboli in the descending right  pulmonary artery.  No acute aortic abnormality. 2.  Enlarging left upper lobe mass encasing and occluding the left upper lobe pulmonary artery, today measuring 57 mm x 46 mm on axial imaging. 3.  Emphysema and basilar atelectasis.  Critical Value/emergent results were called by telephone at the time of interpretation on 08/28/2012. at 2021 hours to Dr.RANCOUR, who verbally acknowledged these results.   Original Report Authenticated By: Andreas Newport, M.D.   Mr Laqueta Jean ZO Contrast  08/29/2012   *RADIOLOGY REPORT*  Clinical Data: Lung mass.  Question intracranial metastatic disease.  MRI HEAD WITHOUT AND WITH CONTRAST  Technique:  Multiplanar, multiecho pulse sequences of the brain and surrounding structures were obtained according to standard protocol without and with intravenous contrast  Contrast: 14mL MULTIHANCE GADOBENATE DIMEGLUMINE 529 MG/ML IV SOLN  Comparison: None.  Findings: No intracranial enhancing lesion or bony destructive lesion to suggest the presence of intracranial metastatic disease.  No acute infarct.  No intracranial hemorrhage.  Remote left corona radiata and basal ganglia infarcts.  Small vessel disease type changes.  Global atrophy without hydrocephalus.  Major intracranial vascular structures are patent.  Mucosal thickening paranasal sinuses with polypoid appearance inferior aspect left maxillary sinus.  Mild cervical spondylotic changes C3-4.  Cervical medullary junction, pituitary region, pineal region and orbital structures unremarkable.  IMPRESSION: No evidence of intracranial metastatic disease.  Please see above.   Original Report Authenticated By: Lacy Duverney, M.D.   Dg Pelvis Portable  09/20/2012   *RADIOLOGY REPORT*  Clinical Data: Fall, leg pain, history of lung cancer  PORTABLE PELVIS  Comparison: PET CT 08/26/2012  Findings: Bony remodeling of the right proximal femur with cerclage wires partly visualized, likely indicating fixation of remote fracture.  Severe right hip  degenerative change is noted.  Right inguinal clips are present.  Vascular calcifications are noted. Lumbar spine degenerative change identified.  No displaced pelvic fracture or visualized proximal femoral fracture.  IMPRESSION: No acute pelvic fracture.   Original Report Authenticated By: Christiana Pellant, M.D.   Nm Pet Image Initial (pi) Skull Base To Thigh  08/27/2012   **ADDENDUM** CREATED: 08/27/2012 09:08:12  Upon further review, there is a filling defect within the right main pulmonary artery (image 35, series two) consistent with a pulmonary embolism.  These findings will be discussed at the interdisciplinary  thoracic cancer conference on 08/28/2012  **END ADDENDUM** SIGNED BY: Genevive Bi, M.D.  08/26/2012   *RADIOLOGY REPORT*  Clinical Data: Initial treatment strategy for lung cancer.  NUCLEAR MEDICINE PET SKULL BASE TO THIGH  Fasting Blood Glucose:  97  Technique:  16.3 mCi F-18 FDG was injected intravenously. CT data was obtained and used for attenuation correction and anatomic localization only.  (This was not acquired as a diagnostic CT examination.) Additional exam technical data entered on technologist worksheet.  Comparison:  Chest CT 07/18/2012.  Findings:  Neck: No hypermetabolic lymph nodes in the neck.  Chest:  Again noted is a large macrolobulated mass measuring approximate 5.3  x 4.9 cm which makes a broad contact with the pleura adjacent to the lateral aspect of the aortic arch, as well as anteriorly and superiorly.  This lesion is hypermetabolic (SUVmax = 19.5), although there is relative hypometabolism centrally, which is likely related to central necrosis in this biopsy proven squamous cell carcinoma.  No definite hypermetabolic mediastinal or hilar lymphadenopathy.  Numerous bilateral hilar and mediastinal calcified lymph nodes are noted.  Heart size is mildly enlarged. There is atherosclerosis of the thoracic aorta, the great vessels of the mediastinum and the coronary arteries,  including calcified atherosclerotic plaque in the left main, left anterior descending, left circumflex and right coronary arteries. There is a background of moderate centrilobular emphysema.  Patchy ground- glass attenuation and septal thickening is noted in the right lower lobe inferiorly, which may represent sequela of recent mild aspiration.  No pneumothorax.  No pleural effusions.  Abdomen/Pelvis:  No abnormal hypermetabolic activity within the liver, pancreas, adrenal glands, or spleen.  No hypermetabolic lymph nodes in the abdomen or pelvis.  Several calcifications are noted in the left renal hilum, the majority of which favored to be vascular.  However, there is at least one 5 mm nonobstructive calculus in the lower pole collecting system of the left kidney (image 142 of series 2).  Extensive atherosclerosis throughout the abdominal and pelvic vasculature, without definite aneurysm.  Skeleton:  No focal hypermetabolic activity to suggest skeletal metastasis.  Old healed fracture of the right proximal femur with postoperative changes of cerclage fixation incompletely imaged.  IMPRESSION: 1.  5.3 x 4.9 cm pleural based macrolobulated hypermetabolic left upper lobe mass with central necrosis, compatible with the biopsy proven squamous cell carcinoma.  No associated mediastinal or hilar lymphadenopathy, and no definite signs of distal metastatic disease on today's examination.  Findings are compatible with T2b, N0, Mx disease (i.e., likely stage IIA). 2. 5 mm nonobstructive calculus in the lower pole collecting system of the left kidney. 3. Atherosclerosis, including left main and three-vessel coronary artery disease.  Assessment for potential risk factor modification, dietary therapy or pharmacologic therapy may be warranted, if clinically indicated. 4.  Additional incidental findings, as above.  Original Report Authenticated By: Trudie Reed, M.D.   Labs: Basic Metabolic Panel:  Recent Labs Lab  09/20/12 2105  NA 131*  K 4.3  CL 98  CO2 22  GLUCOSE 109*  BUN 20  CREATININE 1.21  CALCIUM 9.3   CBC:  Recent Labs Lab 09/20/12 2105  WBC 10.5  NEUTROABS 7.5  HGB 11.5*  HCT 33.1*  MCV 86.9  PLT 202    Signed:  Suzetta Timko  [facility] 09/21/2012, 12:06 PM

## 2012-09-21 NOTE — Progress Notes (Signed)
Utilization review completed.  P.J. Semya Klinke,RN,BSN Case Manager [phone]  

## 2012-09-21 NOTE — H&P (Signed)
[facility] History and Physical  Kimberley Dastrup ZOX:[id number] DOB: 04-Sep-1932    PCP:   Aura Dials, MD   Chief Complaint: mechanical fall with possible small parietal bleed.  HPI: William Walters is an 79 y.o. male  With hx of SCC of the lung, non-operative candidate, hx of Alzheimer disease, HTN, prior CVAs, recent PE on anticoagulation,  fell at home.  He denied HA, nausea, vomiting, or palpitation.  Evaluation in the ER included a head CT which showed a small hyperintensity in the parietal lobe which could be consistent with hemorrhage.  Neurosurgery was consulted by EDP and Dr Yetta Barre didn't feel that this was significant.  He recommended patient be discharged if family is comfortable watching him.  Originally plan was made to discharged him home, but family later didn't feel comfortable doing so.  Dr Yetta Barre didn't feel a follow up head CT would be required. Hospitalist was asked to admit him for observation.  His INR is 2.0. He also has a laceration on his right parietal area requiring sutures.  Rewiew of Systems: I was not able to obtain meaningful ROS.  He has no HA.  Past Medical History  Diagnosis Date  . Hypertension   . Stroke   . Alzheimer disease   . Cancer     Lung    History reviewed. No pertinent past surgical history.  Medications:  HOME MEDS: Prior to Admission medications   Medication Sig Start Date End Date Taking? Authorizing Provider  captopril (CAPOTEN) 50 MG tablet Take 100 mg by mouth 2 (two) times daily.   Yes Historical Provider, MD  hydrochlorothiazide (HYDRODIURIL) 25 MG tablet Take 25 mg by mouth daily.   Yes Historical Provider, MD  acetaminophen-codeine (TYLENOL #3) 300-30 MG per tablet Take 1-2 tablets by mouth every 6 (six) hours as needed for pain. 09/20/12   Derwood Kaplan, MD     Allergies:  No Known Allergies  Social History:   reports that he has quit smoking. He has never used smokeless tobacco. He reports that he does not drink  alcohol or use illicit drugs.  Family History: No family history on file.   Physical Exam: Filed Vitals:   09/20/12 2300 09/20/12 2315 09/20/12 2330 09/20/12 2345  BP: 135/56 138/60 113/93 132/66  Pulse: 84 67 81 80  Temp:      TempSrc:      Resp: 17 16 15 13   SpO2: 95% 97% 98% 96%   Blood pressure 132/66, pulse 80, temperature 98.4 F (36.9 C), temperature source Oral, resp. rate 13, SpO2 96.00%.  GEN:  Pleasant  patient lying in the stretcher in no acute distress; cooperative with exam. PSYCH:  Confused.; does not appear anxious or depressed; affect is appropriate. HEENT: Mucous membranes pink and anicteric; PERRLA; EOM intact; no cervical lymphadenopathy nor thyromegaly or carotid bruit; no JVD; There were no stridor. Neck is very supple. Breasts:: Not examined CHEST WALL: No tenderness CHEST: Normal respiration, clear to auscultation bilaterally.  HEART: Regular rate and rhythm.  There are no murmur, rub, or gallops.   BACK: No kyphosis or scoliosis; no CVA tenderness ABDOMEN: soft and non-tender; no masses, no organomegaly, normal abdominal bowel sounds; no pannus; no intertriginous candida. There is no rebound and no distention. Rectal Exam: Not done EXTREMITIES: No bone or joint deformity; age-appropriate arthropathy of the hands and knees; no edema; no ulcerations.  There is no calf tenderness. Genitalia: not examined PULSES: 2+ and symmetric SKIN: Normal hydration no rash or ulceration.  Laceration  on right parietal area.  Sutured. CNS: Cranial nerves 2-12 grossly intact no focal lateralizing neurologic deficit.  Speech is fluent; uvula elevated with phonation, facial symmetry and tongue midline. He has right hemiplegia. Labs on Admission:  Basic Metabolic Panel:  Recent Labs Lab 09/20/12 2105  NA 131*  K 4.3  CL 98  CO2 22  GLUCOSE 109*  BUN 20  CREATININE 1.21  CALCIUM 9.3   Liver Function Tests: No results found for this basename: AST, ALT, ALKPHOS,  BILITOT, PROT, ALBUMIN,  in the last 168 hours No results found for this basename: LIPASE, AMYLASE,  in the last 168 hours No results found for this basename: AMMONIA,  in the last 168 hours CBC:  Recent Labs Lab 09/20/12 2105  WBC 10.5  NEUTROABS 7.5  HGB 11.5*  HCT 33.1*  MCV 86.9  PLT 202   Cardiac Enzymes: No results found for this basename: CKTOTAL, CKMB, CKMBINDEX, TROPONINI,  in the last 168 hours  CBG: No results found for this basename: GLUCAP,  in the last 168 hours   Radiological Exams on Admission: Ct Head Wo Contrast  09/20/2012   *RADIOLOGY REPORT*  Clinical Data: Fall, right frontal scalp laceration  CT HEAD WITHOUT CONTRAST  Technique:  Contiguous axial images were obtained from the base of the skull through the vertex without contrast.  Comparison: MRI 08/29/2012  Findings: Diffuse mild cortical volume loss noted with proportional ventricular prominence.  Periventricular white matter hypodensity noted with more focal left frontal periventricular encephalomalacia.  There is curvilinear hyperdensity coursing along the sulcus at the high right parietal occipital region, for example image 24.  No midline shift.  No mass lesion identified.  Mild pansinusitis.  No skull fracture.  Right periorbital soft tissue laceration, image 5.  IMPRESSION: Right periorbital soft tissue laceration.  High right parietal occipital focal hyperdensity which could be artifactual but could represent subarachnoid hemorrhage in the setting of a fall.  Critical Value/emergent results were called by telephone at the time of interpretation on 09/20/12 at 10:05 p.m. to Felicie Morn, PA, who verbally acknowledged these results.   Original Report Authenticated By: Christiana Pellant, M.D.   Dg Pelvis Portable  09/20/2012   *RADIOLOGY REPORT*  Clinical Data: Fall, leg pain, history of lung cancer  PORTABLE PELVIS  Comparison: PET CT 08/26/2012  Findings: Bony remodeling of the right proximal femur with cerclage  wires partly visualized, likely indicating fixation of remote fracture.  Severe right hip degenerative change is noted.  Right inguinal clips are present.  Vascular calcifications are noted. Lumbar spine degenerative change identified.  No displaced pelvic fracture or visualized proximal femoral fracture.  IMPRESSION: No acute pelvic fracture.   Original Report Authenticated By: Christiana Pellant, M.D.     Assessment/Plan Present on Admission:  . Lung mass . Alzheimer disease . Pulmonary embolism Laceration Possible parietal ICH.  PLAN:  I will admit him for obs.  Will hold off on his anticoagulation at this time.  I suspect his anticoagulation (He was supposed to be on Xarelto, but it wasn't listed on his home meds)  can be resumed in a few days, but please check with Dr Yetta Barre.  His anti HTN medications are continued.  He is stable, presumped full code, and will be admitted to [facility] service.    Other plans as per orders.  Code Status: Presumped full code.  Houston Siren, MD. [facility] Pager [phone] 7pm to 7am.  09/21/2012, 1:51 AM

## 2012-09-22 ENCOUNTER — Encounter: Payer: Self-pay | Admitting: Radiation Oncology

## 2012-09-22 NOTE — Progress Notes (Signed)
  Radiation Oncology         [phone] ________________________________  Name: William Walters MRN: [id number]  Date: 09/19/2012  DOB: [birth year]  Weekly Radiation Therapy Management  Current Dose: 5.4 Gy     Planned Dose:  70.2 Gy  Narrative . . . . . . . . The patient presents for routine under treatment assessment.                                                    The patient is without complaint.                                 Set-up films were reviewed.                                 The chart was checked. Physical Findings. . .  weight is 142 lb (64.411 kg). His blood pressure is 132/60 and his pulse is 72. His respiration is 18 and oxygen saturation is 92%. . Weight essentially stable.  No significant changes. Impression . . . . . . . The patient is tolerating radiation. Plan . . . . . . . . . . . . Continue treatment as planned.  ________________________________  Artist Pais. Kathrynn Running, M.D.

## 2012-09-23 ENCOUNTER — Ambulatory Visit
Admission: RE | Admit: 2012-09-23 | Discharge: 2012-09-23 | Disposition: A | Discharge status: Home / Self Care | Payer: Medicare Other | Source: Ambulatory Visit | Attending: Radiation Oncology | Admitting: Radiation Oncology

## 2012-09-24 ENCOUNTER — Telehealth: Payer: Self-pay | Admitting: Radiation Oncology

## 2012-09-24 ENCOUNTER — Ambulatory Visit: Payer: Medicare Other

## 2012-09-24 NOTE — Telephone Encounter (Signed)
Informed by Amy, RT that patient did not show for appointment today. Phoned home number but, got recording that indicates calls can't be taken at this time without option to leave message. Phoned daughter who assumed he came for treatment today. Daughter will return this writer's call once she has checked on the status of her father. Provided contact information for this Clinical research associate. Awaiting return call.

## 2012-09-25 ENCOUNTER — Ambulatory Visit
Admission: RE | Admit: 2012-09-25 | Discharge: 2012-09-25 | Disposition: A | Discharge status: Home / Self Care | Payer: Medicare Other | Source: Ambulatory Visit | Attending: Radiation Oncology | Admitting: Radiation Oncology

## 2012-09-26 ENCOUNTER — Encounter: Payer: Self-pay | Admitting: Radiation Oncology

## 2012-09-26 ENCOUNTER — Ambulatory Visit
Admission: RE | Admit: 2012-09-26 | Discharge: 2012-09-26 | Disposition: A | Discharge status: Home / Self Care | Payer: Medicare Other | Source: Ambulatory Visit | Attending: Radiation Oncology | Admitting: Radiation Oncology

## 2012-09-26 VITALS — BP 100/57 | HR 82 | Resp 16 | Wt 142.0 lb

## 2012-09-26 DIAGNOSIS — R918 Other nonspecific abnormal finding of lung field: Secondary | ICD-10-CM

## 2012-09-26 NOTE — Progress Notes (Signed)
  Radiation Oncology         [phone]) [phone] ________________________________  Name: William Walters MRN: [id number]  Date: 09/26/2012  DOB: 10-04-1932  Weekly Radiation Therapy Management  Current Dose: 10.8 Gy     Planned Dose:  70.2 Gy  Narrative . . . . . . . . The patient presents for routine under treatment assessment.                                                   Patient denies cough or shortness of breath. Patient reports occasional difficult swallowing but, denies pain. Patient denies skin changes within treatment area. Patient reports using radiaplex bid as directed. Stitches to right eyebrow noted. Patient reports falling out of his motorized wheelchair and hitting his head. Patient was admitted to hospital following this incident                                 Set-up films were reviewed.                                 The chart was checked. Physical Findings. . .  weight is 142 lb (64.411 kg). His blood pressure is 100/57 and his pulse is 82. His respiration is 16. . Weight essentially stable.  No significant changes. Impression . . . . . . . The patient is  tolerating radiation. Plan . . . . . . . . . . . . Continue treatment as planned.  ________________________________  Artist Pais. Kathrynn Running, M.D.

## 2012-09-26 NOTE — Progress Notes (Signed)
Patient denies cough or shortness of breath. Patient reports occasional difficult swallowing but, denies pain. Patient denies skin changes within treatment area. Patient reports using radiaplex bid as directed. Stitches to right eyebrow noted. Patient reports falling out of his motorized wheelchair and hitting his head. Patient was admitted to hospital following this incident.

## 2012-09-29 ENCOUNTER — Ambulatory Visit
Admission: RE | Admit: 2012-09-29 | Discharge: 2012-09-29 | Disposition: A | Discharge status: Home / Self Care | Payer: Medicare Other | Source: Ambulatory Visit | Attending: Radiation Oncology | Admitting: Radiation Oncology

## 2012-09-30 ENCOUNTER — Ambulatory Visit
Admission: RE | Admit: 2012-09-30 | Discharge: 2012-09-30 | Disposition: A | Discharge status: Home / Self Care | Payer: Medicare Other | Source: Ambulatory Visit | Attending: Radiation Oncology | Admitting: Radiation Oncology

## 2012-10-01 ENCOUNTER — Ambulatory Visit: Payer: Medicare Other | Admitting: Radiation Oncology

## 2012-10-01 ENCOUNTER — Ambulatory Visit
Admission: RE | Admit: 2012-10-01 | Discharge: 2012-10-01 | Disposition: A | Discharge status: Home / Self Care | Payer: Medicare Other | Source: Ambulatory Visit | Attending: Radiation Oncology | Admitting: Radiation Oncology

## 2012-10-02 ENCOUNTER — Encounter: Payer: Self-pay | Admitting: Radiation Oncology

## 2012-10-02 ENCOUNTER — Ambulatory Visit
Admission: RE | Admit: 2012-10-02 | Discharge: 2012-10-02 | Disposition: A | Discharge status: Home / Self Care | Payer: Medicare Other | Source: Ambulatory Visit | Attending: Radiation Oncology | Admitting: Radiation Oncology

## 2012-10-02 ENCOUNTER — Telehealth: Payer: Self-pay | Admitting: *Deleted

## 2012-10-02 VITALS — BP 124/60 | HR 76 | Temp 97.6°F | Resp 20

## 2012-10-02 DIAGNOSIS — R918 Other nonspecific abnormal finding of lung field: Secondary | ICD-10-CM

## 2012-10-02 NOTE — Telephone Encounter (Signed)
CALLED PATIENT TO INFORM OF APPT. WITH PCP ON 10-06-12- ARRIVAL TIME  OF 9:45 AM WITH DR. Babette Relic BOYD - PH. [phone], ADDRESS - [address], [place] FOR A RETURN CALL

## 2012-10-02 NOTE — Progress Notes (Signed)
   Department of Radiation Oncology  Phone:  [phone] Fax:        [phone] _______________________________  Name: William Walters MRN: [id number]  Date: 10/02/2012  DOB: 04-13-1933  Weekly Radiation Therapy Management  Current Dose: 24.3 Gy     Planned Dose:  70.2 Gy  Narrative . . . . . . . . The patient presents for routine under treatment assessment.                                               The patient is without complaint.                                 Set-up films were reviewed.                                 The chart was checked. Physical Findings. . Ceasar Mons Vitals:   10/02/12 0835  BP: 124/60  Pulse: 76  Temp: 97.6 F (36.4 C)  Resp: 20  .Marland Kitchen Weight essentially stable.  No significant changes. Impression . . . . . . . The patient is  tolerating radiation. Plan . . . . . . . . . . . . Continue treatment as planned.  ________________________________  Artist Pais. Kathrynn Running, M.D.

## 2012-10-02 NOTE — Progress Notes (Signed)
Weekly rad txs 9 lt chest so far,no skin changes, patient denies pain, sob, nausea, no difficulty swallowing, no coughing, appetite good, in electric w/c wt.declined, o2 sats 96% room air 8:38 AM

## 2012-10-03 ENCOUNTER — Ambulatory Visit
Admission: RE | Admit: 2012-10-03 | Discharge: 2012-10-03 | Disposition: A | Discharge status: Home / Self Care | Payer: Medicare Other | Source: Ambulatory Visit | Attending: Radiation Oncology | Admitting: Radiation Oncology

## 2012-10-06 ENCOUNTER — Ambulatory Visit
Admission: RE | Admit: 2012-10-06 | Discharge: 2012-10-06 | Disposition: A | Discharge status: Home / Self Care | Payer: Medicare Other | Source: Ambulatory Visit | Attending: Radiation Oncology | Admitting: Radiation Oncology

## 2012-10-07 ENCOUNTER — Ambulatory Visit
Admission: RE | Admit: 2012-10-07 | Discharge: 2012-10-07 | Disposition: A | Discharge status: Home / Self Care | Payer: Medicare Other | Source: Ambulatory Visit | Attending: Radiation Oncology | Admitting: Radiation Oncology

## 2012-10-08 ENCOUNTER — Ambulatory Visit
Admission: RE | Admit: 2012-10-08 | Discharge: 2012-10-08 | Disposition: A | Discharge status: Home / Self Care | Payer: Medicare Other | Source: Ambulatory Visit | Attending: Radiation Oncology | Admitting: Radiation Oncology

## 2012-10-09 ENCOUNTER — Ambulatory Visit
Admission: RE | Admit: 2012-10-09 | Discharge: 2012-10-09 | Disposition: A | Discharge status: Home / Self Care | Payer: Medicare Other | Source: Ambulatory Visit | Attending: Radiation Oncology | Admitting: Radiation Oncology

## 2012-10-10 ENCOUNTER — Ambulatory Visit
Admission: RE | Admit: 2012-10-10 | Discharge: 2012-10-10 | Disposition: A | Discharge status: Home / Self Care | Payer: Medicare Other | Source: Ambulatory Visit | Attending: Radiation Oncology | Admitting: Radiation Oncology

## 2012-10-10 ENCOUNTER — Telehealth: Payer: Self-pay | Admitting: Radiation Oncology

## 2012-10-10 ENCOUNTER — Encounter: Payer: Self-pay | Admitting: Radiation Oncology

## 2012-10-10 VITALS — BP 123/67 | HR 78 | Resp 18 | Wt 137.8 lb

## 2012-10-10 DIAGNOSIS — R918 Other nonspecific abnormal finding of lung field: Secondary | ICD-10-CM

## 2012-10-10 NOTE — Progress Notes (Signed)
  Radiation Oncology         [phone]) [phone] ________________________________  Name: William Walters MRN: [id number]  Date: 10/10/2012  DOB: 10/14/1932  Weekly Radiation Therapy Management  Current Dose: 40.5 Gy     Planned Dose:  70.2 Gy  Narrative . . . . . . . . The patient presents for routine under treatment assessment.                                                      The patient is without complaint.                                 Set-up films were reviewed.                                 The chart was checked. Physical Findings. . .  weight is 137 lb 12.8 oz (62.506 kg). His blood pressure is 123/67 and his pulse is 78. His respiration is 18 and oxygen saturation is 97%. . Weight essentially stable.  No significant changes. Impression . . . . . . . The patient is  tolerating radiation. Plan . . . . . . . . . . . . Continue treatment as planned.  ________________________________  Artist Pais. Kathrynn Running, M.D.

## 2012-10-10 NOTE — Telephone Encounter (Signed)
Patient reside with his daughter who works during the day. Phoned daughter, Sue Lush, to make her aware of her father's 15 lb weight loss. She explains that "since he fell out of his chair" he has not been eating a lot and has frequent bowel movements. Explained referral to Zenovia Jarred, nutritionist, had been made and that she would be in contact. Questioned status for obtaining new wheelchair. Daughter reports that the PCP has referred them to PT for an evaluation necessary to obtain a new chair. She goes on to explain that their next appointment reference getting a new motorized chair is scheduled for 10/18/2012. Requested staff be notified of anyway assistance could be provided to aid in obtaining this new chair. She explains the chair he presently has is rented, unreliable and stops for no reason.

## 2012-10-10 NOTE — Progress Notes (Signed)
Denies pain. Denies skin changes. Reports using radiaplex bid as directed. Denies difficulty swallowing, cough or shortness of breath. Lost 14 lb since 09/26/2012 despite reporting that he has a great appetite.

## 2012-10-13 ENCOUNTER — Ambulatory Visit
Admission: RE | Admit: 2012-10-13 | Discharge: 2012-10-13 | Disposition: A | Discharge status: Home / Self Care | Payer: Medicare Other | Source: Ambulatory Visit | Attending: Radiation Oncology | Admitting: Radiation Oncology

## 2012-10-14 ENCOUNTER — Ambulatory Visit
Admission: RE | Admit: 2012-10-14 | Discharge: 2012-10-14 | Disposition: A | Discharge status: Home / Self Care | Payer: Medicare Other | Source: Ambulatory Visit | Attending: Radiation Oncology | Admitting: Radiation Oncology

## 2012-10-15 ENCOUNTER — Ambulatory Visit
Admission: RE | Admit: 2012-10-15 | Discharge: 2012-10-15 | Disposition: A | Discharge status: Home / Self Care | Payer: Medicare Other | Source: Ambulatory Visit | Attending: Radiation Oncology | Admitting: Radiation Oncology

## 2012-10-16 ENCOUNTER — Encounter: Payer: Self-pay | Admitting: Vascular Surgery

## 2012-10-16 ENCOUNTER — Ambulatory Visit
Admission: RE | Admit: 2012-10-16 | Discharge: 2012-10-16 | Disposition: A | Discharge status: Home / Self Care | Payer: Medicare Other | Source: Ambulatory Visit | Attending: Radiation Oncology | Admitting: Radiation Oncology

## 2012-10-16 ENCOUNTER — Encounter: Payer: Self-pay | Admitting: Radiation Oncology

## 2012-10-16 VITALS — BP 114/69 | HR 75 | Temp 97.3°F | Resp 16 | Wt 136.2 lb

## 2012-10-16 DIAGNOSIS — R918 Other nonspecific abnormal finding of lung field: Secondary | ICD-10-CM

## 2012-10-16 NOTE — Progress Notes (Signed)
  Radiation Oncology         [phone] ________________________________  Name: William Walters MRN: [id number]  Date: 10/16/2012  DOB: 08-Jan-1933  Weekly Radiation Therapy Management  Current Dose: 51.3 Gy     Planned Dose:  70.2 Gy  Narrative . . . . . . . . The patient presents for routine under treatment assessment.                                                      Weight stable. Denies pain. Denies pain or difficulty associated with swallowing. Denies persistent cough but, does reports each morning "coughing up white colored cold without blood." Denies skin changes. Reports that his daughter applies radiaplex bid to his chest and back. Denies shortness of breath                                 Set-up films were reviewed.                                 The chart was checked. Physical Findings. . .  weight is 136 lb 3.2 oz (61.78 kg). His oral temperature is 97.3 F (36.3 C). His blood pressure is 114/69 and his pulse is 75. His respiration is 16 and oxygen saturation is 95%. . Weight essentially stable.  No significant changes. Impression . . . . . . . The patient is tolerating radiation. Plan . . . . . . . . . . . . Continue treatment as planned.  ________________________________  Artist Pais. Kathrynn Running, M.D.

## 2012-10-16 NOTE — Progress Notes (Signed)
Weight stable. Denies pain. Denies pain or difficulty associated with swallowing. Denies persistent cough but, does reports each morning "coughing up white colored cold without blood." Denies skin changes. Reports that his daughter applies radiaplex bid to his chest and back. Denies shortness of breath.

## 2012-10-17 ENCOUNTER — Encounter: Payer: Medicare Other | Admitting: Vascular Surgery

## 2012-10-17 ENCOUNTER — Ambulatory Visit: Payer: Medicare Other

## 2012-10-20 ENCOUNTER — Ambulatory Visit: Payer: Medicare Other

## 2012-10-20 ENCOUNTER — Ambulatory Visit: Admission: RE | Admit: 2012-10-20 | Payer: Medicare Other | Source: Ambulatory Visit

## 2012-10-21 ENCOUNTER — Ambulatory Visit: Payer: Medicare Other | Admitting: Nutrition

## 2012-10-21 ENCOUNTER — Ambulatory Visit
Admission: RE | Admit: 2012-10-21 | Discharge: 2012-10-21 | Disposition: A | Discharge status: Home / Self Care | Payer: Medicare Other | Source: Ambulatory Visit | Attending: Radiation Oncology | Admitting: Radiation Oncology

## 2012-10-21 NOTE — Progress Notes (Signed)
This is a 79 year old male patient of Dr. Kathrynn Running diagnosed with lung cancer. He is receiving radiation therapy.  Past medical history includes hypertension, stroke, and Alzheimer's disease.  Medications were reviewed.  Labs include sodium 131 and glucose 109 on May 24.  Height: 6 feet 1 inch. Weight: 136.2 pounds June 19. Usual body weight 145 pounds 08/28/2012. BMI: 17.97.  Patient reports poor appetite. He states he feels well. He states every day is a good day. He has consumed Ensure in the past and has enjoyed it. He is noted to have poor dentition but denies problems eating. Patient reports various weight throughout his lifetime with his usual body weight being around 200 pounds when he was younger. Patient's current weight reflects 6% weight loss in approximately 6 weeks.  Nutrition diagnosis: Inadequate oral intake related to diagnosis of lung cancer as evidenced by 6% weight loss in 6 weeks and BMI of 17.97 which is underweight.  Patient meets criteria for moderate malnutrition in the context of acute illness secondary to 5% weight loss in one month and depletion of body fat and muscle on physical exam.  Intervention: Patient was educated to increase high-calorie, high-protein foods throughout the day to prevent further weight loss. I educated patient to begin Ensure Plus twice a day to 3 times a day as tolerated between meals. I have provided patient with one case of complementary Ensure Plus to take with him today. I've answered patient's questions. Teach back method used.  Monitoring, evaluation, goals: Patient will tolerate 3 meals daily with Ensure Plus twice a day to 3 times a day to promote maintenance of lean body mass and minimize further weight loss.  Next visit: Patient will contact me with questions or concerns; he has my contact information.

## 2012-10-22 ENCOUNTER — Ambulatory Visit
Admission: RE | Admit: 2012-10-22 | Discharge: 2012-10-22 | Disposition: A | Discharge status: Home / Self Care | Payer: Medicare Other | Source: Ambulatory Visit | Attending: Radiation Oncology | Admitting: Radiation Oncology

## 2012-10-22 ENCOUNTER — Encounter: Payer: Self-pay | Admitting: Radiation Oncology

## 2012-10-22 ENCOUNTER — Ambulatory Visit: Payer: Medicare Other

## 2012-10-23 ENCOUNTER — Ambulatory Visit: Payer: Medicare Other

## 2012-10-24 ENCOUNTER — Ambulatory Visit: Payer: Medicare Other

## 2012-10-25 ENCOUNTER — Ambulatory Visit
Admission: RE | Admit: 2012-10-25 | Discharge: 2012-10-25 | Disposition: A | Discharge status: Home / Self Care | Payer: Medicare Other | Source: Ambulatory Visit | Attending: Radiation Oncology | Admitting: Radiation Oncology

## 2012-10-27 ENCOUNTER — Ambulatory Visit: Payer: Medicare Other

## 2012-10-27 ENCOUNTER — Other Ambulatory Visit: Payer: Self-pay | Admitting: Radiation Oncology

## 2012-10-28 ENCOUNTER — Ambulatory Visit: Payer: Medicare Other

## 2012-10-29 ENCOUNTER — Ambulatory Visit
Admission: RE | Admit: 2012-10-29 | Discharge: 2012-10-29 | Disposition: A | Discharge status: Home / Self Care | Payer: Medicare Other | Source: Ambulatory Visit | Attending: Radiation Oncology | Admitting: Radiation Oncology

## 2012-10-29 ENCOUNTER — Ambulatory Visit: Payer: Medicare Other

## 2012-10-29 ENCOUNTER — Encounter: Payer: Self-pay | Admitting: Radiation Oncology

## 2012-10-29 NOTE — Progress Notes (Signed)
Patient questioned, "If I think someone is stealing my social security money who do I call?" Explained to the patient he could report it to the police, APS, or the social security office. Patient states, "well I don't want to get anyone in trouble." Patient reports, "I don't go anywhere or do anything but, there is never any money." Patient confirms there is water, food, and lights in his home. Patient reports that he feels safe in his home. Reported this finding to Kathrin Penner who plans to speak with the patient tomorrow.

## 2012-10-29 NOTE — Progress Notes (Signed)
Denies skin changes within treatment field. Reports using radiaplex bid as directed on chest and back. Denies pain or difficulty swallowing. Reports that he is happy his cough has resolved. Denies shortness of breath or dyspnea. Denies pain.

## 2012-10-29 NOTE — Addendum Note (Signed)
Encounter addended by: Agnes Lawrence, RN on: 10/29/2012 10:57 AM<BR>     Documentation filed: Notes Section

## 2012-10-29 NOTE — Progress Notes (Signed)
  Radiation Oncology         [phone] ________________________________  Name: William Walters MRN: [id number]  Date: 10/29/2012  DOB: 05/29/1932  Weekly Radiation Therapy Management  Current Dose: 59.4 Gy     Planned Dose:  70.2 Gy  Narrative . . . . . . . . The patient presents for routine under treatment assessment.                                     Denies skin changes within treatment field. Reports using radiaplex bid as directed on chest and back. Denies pain or difficulty swallowing. Reports that he is happy his cough has resolved. Denies shortness of breath or dyspnea. Denies pain                                 Set-up films were reviewed.                                 The chart was checked. Physical Findings. . .  weight is 136 lb 4.8 oz (61.825 kg). His oral temperature is 97.4 F (36.3 C). His blood pressure is 137/64 and his pulse is 54. His respiration is 18 and oxygen saturation is 95%. . Weight essentially stable.  No significant changes. Impression . . . . . . . The patient is tolerating radiation. Plan . . . . . . . . . . . . Continue treatment as planned.  ________________________________  Artist Pais. Kathrynn Running, M.D.

## 2012-10-30 ENCOUNTER — Ambulatory Visit: Payer: Medicare Other | Admitting: Physical Therapy

## 2012-10-30 ENCOUNTER — Ambulatory Visit: Payer: Medicare Other

## 2012-10-30 ENCOUNTER — Ambulatory Visit
Admission: RE | Admit: 2012-10-30 | Discharge: 2012-10-30 | Disposition: A | Discharge status: Home / Self Care | Payer: Medicare Other | Source: Ambulatory Visit | Attending: Radiation Oncology | Admitting: Radiation Oncology

## 2012-11-02 ENCOUNTER — Ambulatory Visit: Payer: Medicare Other

## 2012-11-03 ENCOUNTER — Ambulatory Visit: Payer: Medicare Other

## 2012-11-03 ENCOUNTER — Ambulatory Visit
Admission: RE | Admit: 2012-11-03 | Discharge: 2012-11-03 | Disposition: A | Discharge status: Home / Self Care | Payer: Medicare Other | Source: Ambulatory Visit | Attending: Radiation Oncology | Admitting: Radiation Oncology

## 2012-11-04 ENCOUNTER — Ambulatory Visit: Payer: Medicare Other

## 2012-11-04 ENCOUNTER — Ambulatory Visit
Admission: RE | Admit: 2012-11-04 | Discharge: 2012-11-04 | Disposition: A | Discharge status: Home / Self Care | Payer: Medicare Other | Source: Ambulatory Visit | Attending: Radiation Oncology | Admitting: Radiation Oncology

## 2012-11-04 ENCOUNTER — Encounter: Payer: Self-pay | Admitting: Radiation Oncology

## 2012-11-04 NOTE — Progress Notes (Signed)
Weekly Management Note Current Dose:67.5 Gy  Projected Dose:70.2 Gy   Narrative:  The patient presents for routine under treatment assessment.  CBCT/MVCT images/Port film x-rays were reviewed.  The chart was checked. Doing well. No complaints of dysphagia or pain. Using radiaplex. Somewhat confused (normal for him per RN).   Physical Findings:  Breathing normally. In a wheelchair. Left arm paretic.   Vitals:  Filed Vitals:   11/04/12 0837  BP: 121/65  Pulse: 75  Resp: 18   Weight:  Wt Readings from Last 3 Encounters:  11/04/12 135 lb (61.236 kg)  10/29/12 136 lb 4.8 oz (61.825 kg)  10/16/12 136 lb 3.2 oz (61.78 kg)   Lab Results  Component Value Date   WBC 10.5 09/20/2012   HGB 11.5* 09/20/2012   HCT 33.1* 09/20/2012   MCV 86.9 09/20/2012   PLT 202 09/20/2012   Lab Results  Component Value Date   CREATININE 1.21 09/20/2012   BUN 20 09/20/2012   NA 131* 09/20/2012   K 4.3 09/20/2012   CL 98 09/20/2012   CO2 22 09/20/2012     Impression:  The patient is tolerating radiation.  Plan:  Continue treatment as planned.

## 2012-11-04 NOTE — Progress Notes (Signed)
Denies pain. Denies cough. Denies shortness of breath. Denies painful or difficult swallowing. Denies skin changes to anterior or posterior chest. Reports daughter applies radiaplex bid as directed. Patient smiling today and reports that he slept well last night. Denies nausea, vomiting, headache, or dizziness. Patient has no complaints at this time.

## 2012-11-05 ENCOUNTER — Ambulatory Visit
Admission: RE | Admit: 2012-11-05 | Discharge: 2012-11-05 | Disposition: A | Discharge status: Home / Self Care | Payer: Medicare Other | Source: Ambulatory Visit | Attending: Radiation Oncology | Admitting: Radiation Oncology

## 2012-11-05 ENCOUNTER — Encounter: Payer: Self-pay | Admitting: Radiation Oncology

## 2012-11-05 ENCOUNTER — Ambulatory Visit: Payer: Medicare Other

## 2012-11-06 ENCOUNTER — Ambulatory Visit: Payer: Medicare Other

## 2012-11-06 ENCOUNTER — Encounter: Payer: Medicare Other | Admitting: Vascular Surgery

## 2012-11-06 ENCOUNTER — Ambulatory Visit: Payer: Medicare Other | Attending: Family Medicine | Admitting: Physical Therapy

## 2012-11-06 DIAGNOSIS — M6281 Muscle weakness (generalized): Secondary | ICD-10-CM | POA: Insufficient documentation

## 2012-11-06 DIAGNOSIS — IMO0001 Reserved for inherently not codable concepts without codable children: Secondary | ICD-10-CM | POA: Insufficient documentation

## 2012-11-06 DIAGNOSIS — R269 Unspecified abnormalities of gait and mobility: Secondary | ICD-10-CM | POA: Insufficient documentation

## 2012-11-07 ENCOUNTER — Ambulatory Visit: Payer: Medicare Other

## 2012-11-08 NOTE — Progress Notes (Signed)
  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 He was set up daily with cone beam CT to ensure target coverage and assure spinal cord sparing as planned.  Narrative: The patient tolerated radiation treatment relatively well.   He was able to complete radiotherapy with no significant side effects other than modest fatigue.  Plan: The patient has completed radiation treatment. The patient will return to radiation oncology clinic for routine followup in one month. We will arrange followup chest CT 6-8 weeks following radiation to assess treatment response. ________________________________  Artist Pais. Kathrynn Running, M.D.

## 2012-12-04 ENCOUNTER — Ambulatory Visit
Admission: RE | Admit: 2012-12-04 | Discharge: 2012-12-04 | Disposition: A | Discharge status: Home / Self Care | Payer: Medicare Other | Source: Ambulatory Visit | Attending: Radiation Oncology | Admitting: Radiation Oncology

## 2012-12-04 ENCOUNTER — Telehealth: Payer: Self-pay | Admitting: *Deleted

## 2012-12-04 ENCOUNTER — Encounter: Payer: Self-pay | Admitting: Radiation Oncology

## 2012-12-04 NOTE — Progress Notes (Signed)
  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  He is without complaint.                              ALLERGIES:  has No Known Allergies.  Meds: Current Outpatient Prescriptions  Medication Sig Dispense Refill  . acetaminophen-codeine (TYLENOL #3) 300-30 MG per tablet Take 1-2 tablets by mouth every 6 (six) hours as needed for pain.  15 tablet  0  . captopril (CAPOTEN) 50 MG tablet Take 100 mg by mouth 2 (two) times daily.      . hydrochlorothiazide (HYDRODIURIL) 25 MG tablet Take 25 mg by mouth daily.      . Wound Cleansers (RADIAPLEX EX) Apply topically.       No current facility-administered medications for this encounter.    Physical Findings: The patient is in no acute distress. Patient is alert and oriented.  oral temperature is 98.2 F (36.8 C). His blood pressure is 116/68 and his pulse is 91. His respiration is 18 and oxygen saturation is 100%. .  He does have pronounced hyperpigmentation in the treatment field on his back.  No significant changes.   Impression:  The patient is recovering from the effects of radiation.    Plan:  CT Chest in 2 weeks then follow-up.  _____________________________________  Artist Pais. Kathrynn Running, M.D.

## 2012-12-04 NOTE — Telephone Encounter (Signed)
CALLED PATIENT'S DAUGHTER - ANDREA TO INFORM OF APPTS. ARRANGED FOR HER DAD, LVM FOR A RETURN CALL

## 2012-12-04 NOTE — Progress Notes (Signed)
Reports on occasional dry cough. Denies difficulty or painful swallowing. Denies shortness of breath or dyspnea. Denies chest pain. Daughter confirms his appetite has improved. Reports skin of chest has returned to normal color and appearance. Reports only mild hyperpigmentation of upper back.

## 2012-12-09 ENCOUNTER — Emergency Department (HOSPITAL_COMMUNITY)
Admission: EM | Admit: 2012-12-09 | Discharge: 2012-12-10 | Disposition: A | Discharge status: Home / Self Care | Payer: Medicare Other | Attending: Emergency Medicine | Admitting: Emergency Medicine

## 2012-12-09 ENCOUNTER — Encounter (HOSPITAL_COMMUNITY): Payer: Self-pay | Admitting: Emergency Medicine

## 2012-12-09 DIAGNOSIS — Z79899 Other long term (current) drug therapy: Secondary | ICD-10-CM | POA: Insufficient documentation

## 2012-12-09 DIAGNOSIS — F0281 Dementia in other diseases classified elsewhere with behavioral disturbance: Secondary | ICD-10-CM | POA: Insufficient documentation

## 2012-12-09 DIAGNOSIS — Z85118 Personal history of other malignant neoplasm of bronchus and lung: Secondary | ICD-10-CM | POA: Insufficient documentation

## 2012-12-09 DIAGNOSIS — Z8673 Personal history of transient ischemic attack (TIA), and cerebral infarction without residual deficits: Secondary | ICD-10-CM | POA: Insufficient documentation

## 2012-12-09 DIAGNOSIS — F0390 Unspecified dementia without behavioral disturbance: Secondary | ICD-10-CM | POA: Diagnosis present

## 2012-12-09 DIAGNOSIS — F919 Conduct disorder, unspecified: Secondary | ICD-10-CM | POA: Insufficient documentation

## 2012-12-09 DIAGNOSIS — F02818 Dementia in other diseases classified elsewhere, unspecified severity, with other behavioral disturbance: Secondary | ICD-10-CM | POA: Insufficient documentation

## 2012-12-09 DIAGNOSIS — I10 Essential (primary) hypertension: Secondary | ICD-10-CM | POA: Insufficient documentation

## 2012-12-09 DIAGNOSIS — Z87891 Personal history of nicotine dependence: Secondary | ICD-10-CM | POA: Insufficient documentation

## 2012-12-09 DIAGNOSIS — R451 Restlessness and agitation: Secondary | ICD-10-CM

## 2012-12-09 DIAGNOSIS — F22 Delusional disorders: Secondary | ICD-10-CM | POA: Insufficient documentation

## 2012-12-09 DIAGNOSIS — F0392 Unspecified dementia, unspecified severity, with psychotic disturbance: Secondary | ICD-10-CM | POA: Diagnosis present

## 2012-12-09 DIAGNOSIS — F0280 Dementia in other diseases classified elsewhere without behavioral disturbance: Secondary | ICD-10-CM | POA: Insufficient documentation

## 2012-12-09 DIAGNOSIS — G309 Alzheimer's disease, unspecified: Secondary | ICD-10-CM | POA: Insufficient documentation

## 2012-12-09 DIAGNOSIS — R456 Violent behavior: Secondary | ICD-10-CM

## 2012-12-09 LAB — CBC
HCT: 43.4 % (ref 39.0–52.0)
Hemoglobin: 14.9 g/dL (ref 13.0–17.0)
RBC: 4.81 MIL/uL (ref 4.22–5.81)
WBC: 10.7 10*3/uL — ABNORMAL HIGH (ref 4.0–10.5)

## 2012-12-09 LAB — RAPID URINE DRUG SCREEN, HOSP PERFORMED: Barbiturates: NOT DETECTED

## 2012-12-09 NOTE — ED Notes (Signed)
[id number]> Expected date:<BR> Expected time:<BR> Means of arrival:<BR> Comments:<BR> EMS 79yo M; hallucinations

## 2012-12-09 NOTE — ED Notes (Signed)
Pt changed into blue scrubs and wanded by security.  Pt belongings located at RN station across from room 4.  Belongings consist of: 1 pair of white tennis shoes / 1 brown wallet containing cards and $1.30 / brown camo lounge pants / tee shirt / 1 gold tone ring placed in bio-hazard bag

## 2012-12-10 ENCOUNTER — Emergency Department (HOSPITAL_COMMUNITY): Payer: Medicare Other

## 2012-12-10 ENCOUNTER — Encounter (HOSPITAL_COMMUNITY): Payer: Self-pay | Admitting: Registered Nurse

## 2012-12-10 DIAGNOSIS — F0391 Unspecified dementia with behavioral disturbance: Secondary | ICD-10-CM

## 2012-12-10 DIAGNOSIS — F22 Delusional disorders: Secondary | ICD-10-CM | POA: Diagnosis present

## 2012-12-10 LAB — URINALYSIS, ROUTINE W REFLEX MICROSCOPIC
Bilirubin Urine: NEGATIVE
Hgb urine dipstick: NEGATIVE
Ketones, ur: NEGATIVE mg/dL
Nitrite: NEGATIVE
Protein, ur: NEGATIVE mg/dL
Urobilinogen, UA: 1 mg/dL (ref 0.0–1.0)

## 2012-12-10 LAB — SALICYLATE LEVEL: Salicylate Lvl: <2 mg/dL — ABNORMAL LOW (ref 2.8–20.0)

## 2012-12-10 LAB — COMPREHENSIVE METABOLIC PANEL
Alkaline Phosphatase: 70 U/L (ref 39–117)
BUN: 23 mg/dL (ref 6–23)
CO2: 30 mEq/L (ref 19–32)
Chloride: 103 mEq/L (ref 96–112)
GFR calc Af Amer: 76 mL/min — ABNORMAL LOW (ref >90)
Glucose, Bld: 129 mg/dL — ABNORMAL HIGH (ref 70–99)
Potassium: 4.1 mEq/L (ref 3.5–5.1)
Total Bilirubin: 0.3 mg/dL (ref 0.3–1.2)

## 2012-12-10 LAB — ETHANOL: Alcohol, Ethyl (B): <11 mg/dL (ref 0–11)

## 2012-12-10 LAB — ACETAMINOPHEN LEVEL: Acetaminophen (Tylenol), Serum: <15 ug/mL (ref 10–30)

## 2012-12-10 NOTE — ED Notes (Signed)
Pt sitting on side of bed requesting to speak with "Dr Loreta Ave". Stating he needs to call his daughter so she can contact his dr. Rock Nephew requesting to leave. Daughter called states that "Dr Kathrynn Running does not need to be called, he was seen last Thursday and does not need to be seen by dr."

## 2012-12-10 NOTE — ED Notes (Signed)
Social work at bedside requesting for family. Tech to get family. Wanting to know what going on with pt.

## 2012-12-10 NOTE — Progress Notes (Signed)
Late entry for 12/10/12 1730  CM sent referral to [facility] after speaking with Nydra at [phone]. Fax confirmation received at [phone]

## 2012-12-10 NOTE — ED Provider Notes (Signed)
Pt seen by Psychiatry who recommends discharge with outpatient PCP followup for evaluation and possible placement in SNF.   Antiono Ettinger B. Bernette Mayers, MD 12/10/12 1255

## 2012-12-10 NOTE — Progress Notes (Signed)
ED CM consulted with [place], SW about pt d/c plans

## 2012-12-10 NOTE — Progress Notes (Signed)
[facility] ED CM spoke with pt and daughter, Sue Lush about community resources Confirmed he is now a [employer]. Recently moved to county CM discussed written information [facility], medicaid transportation, Doctor home visits, discounted pharmacies and other [place] resources such as financial assistance, DSS and  health department Sue Lush & Pt voiced understanding and appreciation of resources provided CM reviewed in details medicare guidelines, home health (HH) (length of stay in home, types of [facility] staff available, coverage, primary caregiver, up to 24 hrs before services may be started) and Private duty nursing (PDN-coverage, length of stay in the home types of staff available),  CM provided family with a list of [place] home health agencies and PDN, and snfs.  Choice of Sue Lush [phone]) was to have Cm assist with referrals to [facility] and [facility]. Sue Lush agreed to be family contact for the pt Sue Lush stated [facility] provided PT home services for pt in past  1346 CM left a voice message for [facility] in house coordinator, Baxter Hire for Home health RN referral for disease and medication management
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
## 2012-12-10 NOTE — ED Notes (Signed)
Patient has one bag of belongings in locker 27. 
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
## 2012-12-10 NOTE — BHH Counselor (Addendum)
Writer attempted to conduct a Tele Assessment with the patient.  Writer spoke to the nurse working with the 79 year old patient.  Writer was informed by the nurse that she has already made arrangements to have the patient seen by the NP that is at [facility] for a  (face to face) assessment instead of a Tele Assessment.

## 2012-12-10 NOTE — BH Assessment (Signed)
Dr. Lolly Mustache and Assunta Found NP both recommend pt be d/c home or to nursing home as pt doesn't meet inpatient psychiatric criteria. CM Kim and [facility] met with pt and pt's daughters to discuss resources. Shuvon NP asked Clinical research associate to provide adult day care resources. Writer gave daughter referral for [facility] [phone]. Daughter told Clinical research associate she was ready to be discharged. EDP and RN aware.  Evette Cristal, [place] Assessment Counselor

## 2012-12-10 NOTE — ED Notes (Signed)
Family came to visit pt. Family updated dr on pt status at home. Family only wants 2 daughters to be able to call in and inquire ab pt medical concerns. Oliver Hum and Tyrone Schimke are the only two that can answer and ask questions in regards to pt and his care.

## 2012-12-10 NOTE — BHH Counselor (Addendum)
Writer called pt's daughters to give update that pt is medically and psychiatrically cleared. Writer spoke with each daughter separately. Both daughters are on way to [facility] and request  states she is coming to [facility] to visit father at noon today. Daughter asks if possible to get more info re: possible dx for pt prior to d/c. Writer indicates staff will speak with pt and her prior to d/c to answer questions. Writer contacted CSW [place] and requested CSW meet with sisters to discuss placement options. Jeanice Lim states she will meet with family after she finished discharging some patients.   Evette Cristal, [place] Assessment Counselor

## 2012-12-15 ENCOUNTER — Ambulatory Visit (HOSPITAL_COMMUNITY): Payer: Medicare Other

## 2012-12-18 ENCOUNTER — Encounter (HOSPITAL_COMMUNITY): Payer: Self-pay

## 2012-12-18 ENCOUNTER — Ambulatory Visit (HOSPITAL_COMMUNITY)
Admission: RE | Admit: 2012-12-18 | Discharge: 2012-12-18 | Disposition: A | Discharge status: Home / Self Care | Payer: Medicare Other | Source: Ambulatory Visit | Attending: Radiation Oncology | Admitting: Radiation Oncology

## 2012-12-18 ENCOUNTER — Other Ambulatory Visit: Payer: Self-pay | Admitting: Radiation Oncology

## 2012-12-18 ENCOUNTER — Ambulatory Visit: Admission: RE | Admit: 2012-12-18 | Payer: Medicare Other | Source: Ambulatory Visit | Admitting: Radiation Oncology

## 2012-12-18 DIAGNOSIS — J438 Other emphysema: Secondary | ICD-10-CM | POA: Insufficient documentation

## 2012-12-18 DIAGNOSIS — C3410 Malignant neoplasm of upper lobe, unspecified bronchus or lung: Secondary | ICD-10-CM | POA: Insufficient documentation

## 2012-12-18 DIAGNOSIS — Z923 Personal history of irradiation: Secondary | ICD-10-CM | POA: Insufficient documentation

## 2012-12-19 ENCOUNTER — Telehealth: Payer: Self-pay | Admitting: Radiation Oncology

## 2012-12-19 NOTE — Telephone Encounter (Signed)
Message copied by Agnes Lawrence on Fri Dec 19, 2012  1:44 PM ------      Message from: [place], [place]      Created: Thu Dec 18, 2012  9:52 AM       Sam,            Please call the patient.  The tumor is much smaller and there are no new worrisome findings.  I will order another CT Chest in 6 months.            MM ------

## 2012-12-19 NOTE — Telephone Encounter (Signed)
Phoned patient's daughter, William Walters. 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 Explained Dr. Kathrynn Running will order another CT of his chest in 6 months. Encouraged to contact staff with future needs. She verbalized understanding and expressed appreciation for the call.

## 2012-12-22 ENCOUNTER — Telehealth: Payer: Self-pay | Admitting: *Deleted

## 2012-12-22 NOTE — Telephone Encounter (Signed)
Called patient to inform of test and fu visit, lvm for a return call 

## 2012-12-30 ENCOUNTER — Encounter: Payer: Self-pay | Admitting: Vascular Surgery

## 2012-12-31 ENCOUNTER — Encounter: Payer: Medicare Other | Admitting: Vascular Surgery

## 2013-01-30 ENCOUNTER — Other Ambulatory Visit: Payer: Self-pay | Admitting: Vascular Surgery

## 2013-01-30 DIAGNOSIS — L97919 Non-pressure chronic ulcer of unspecified part of right lower leg with unspecified severity: Secondary | ICD-10-CM

## 2013-02-02 ENCOUNTER — Emergency Department (HOSPITAL_COMMUNITY)
Admission: EM | Admit: 2013-02-02 | Discharge: 2013-02-02 | Disposition: A | Discharge status: Home / Self Care | Payer: Medicare Other | Attending: Emergency Medicine | Admitting: Emergency Medicine

## 2013-02-02 ENCOUNTER — Emergency Department (HOSPITAL_COMMUNITY): Payer: Medicare Other

## 2013-02-02 ENCOUNTER — Encounter (HOSPITAL_COMMUNITY): Payer: Self-pay | Admitting: Emergency Medicine

## 2013-02-02 DIAGNOSIS — F0391 Unspecified dementia with behavioral disturbance: Secondary | ICD-10-CM

## 2013-02-02 DIAGNOSIS — Z87891 Personal history of nicotine dependence: Secondary | ICD-10-CM | POA: Insufficient documentation

## 2013-02-02 DIAGNOSIS — Z8673 Personal history of transient ischemic attack (TIA), and cerebral infarction without residual deficits: Secondary | ICD-10-CM | POA: Insufficient documentation

## 2013-02-02 DIAGNOSIS — Z79899 Other long term (current) drug therapy: Secondary | ICD-10-CM | POA: Insufficient documentation

## 2013-02-02 DIAGNOSIS — F0280 Dementia in other diseases classified elsewhere without behavioral disturbance: Secondary | ICD-10-CM | POA: Insufficient documentation

## 2013-02-02 DIAGNOSIS — F02818 Dementia in other diseases classified elsewhere, unspecified severity, with other behavioral disturbance: Secondary | ICD-10-CM | POA: Insufficient documentation

## 2013-02-02 DIAGNOSIS — I10 Essential (primary) hypertension: Secondary | ICD-10-CM | POA: Insufficient documentation

## 2013-02-02 DIAGNOSIS — Z85118 Personal history of other malignant neoplasm of bronchus and lung: Secondary | ICD-10-CM | POA: Insufficient documentation

## 2013-02-02 DIAGNOSIS — G309 Alzheimer's disease, unspecified: Secondary | ICD-10-CM | POA: Insufficient documentation

## 2013-02-02 DIAGNOSIS — F0281 Dementia in other diseases classified elsewhere with behavioral disturbance: Secondary | ICD-10-CM | POA: Insufficient documentation

## 2013-02-02 LAB — CBC WITH DIFFERENTIAL/PLATELET
Eosinophils Absolute: 2.7 10*3/uL — ABNORMAL HIGH (ref 0.0–0.7)
Eosinophils Relative: 20 % — ABNORMAL HIGH (ref 0–5)
Lymphs Abs: 1.7 10*3/uL (ref 0.7–4.0)
MCH: 30.4 pg (ref 26.0–34.0)
MCHC: 33.3 g/dL (ref 30.0–36.0)
MCV: 91.1 fL (ref 78.0–100.0)
Monocytes Absolute: 0.8 10*3/uL (ref 0.1–1.0)
Platelets: 252 10*3/uL (ref 150–400)
RBC: 4.38 MIL/uL (ref 4.22–5.81)
RDW: 14.2 % (ref 11.5–15.5)

## 2013-02-02 LAB — ETHANOL: Alcohol, Ethyl (B): <11 mg/dL (ref 0–11)

## 2013-02-02 LAB — COMPREHENSIVE METABOLIC PANEL
AST: 30 U/L (ref 0–37)
CO2: 27 mEq/L (ref 19–32)
Calcium: 9.6 mg/dL (ref 8.4–10.5)
Creatinine, Ser: 0.89 mg/dL (ref 0.50–1.35)
GFR calc Af Amer: >90 mL/min (ref >90)
GFR calc non Af Amer: 80 mL/min — ABNORMAL LOW (ref >90)
Glucose, Bld: 112 mg/dL — ABNORMAL HIGH (ref 70–99)
Total Protein: 8.2 g/dL (ref 6.0–8.3)

## 2013-02-02 LAB — URINALYSIS, ROUTINE W REFLEX MICROSCOPIC
Nitrite: NEGATIVE
Specific Gravity, Urine: 1.025 (ref 1.005–1.030)
Urobilinogen, UA: 0.2 mg/dL (ref 0.0–1.0)

## 2013-02-02 LAB — RAPID URINE DRUG SCREEN, HOSP PERFORMED: Opiates: NOT DETECTED

## 2013-02-02 NOTE — Progress Notes (Signed)
[facility] faxed home health orders to [place] at 2127pm, received confirmation of fax at 2129pm.

## 2013-02-02 NOTE — ED Notes (Signed)
Bed: WA20 Expected date:  Expected time:  Means of arrival:  Comments: ems 
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
## 2013-02-02 NOTE — Progress Notes (Signed)
[facility] consulted by EDSW to see if patient has active home health services.  As per [facility] note on 08/13, patient was set up with [facility] for medication and disease management.  Patient's daughter Sue Lush was noted to be be patient's primary care giver.  This EDCM attempted to call patient's daughters Sue Lush and [id number].  EDCM left voice mail message for Sue Lush at [phone] to call [facility] back ASAP.  Unable to get through to [facility].  Phone number listed goes right to a busy signal.
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
## 2013-02-02 NOTE — ED Provider Notes (Signed)
CSN: [id number]     Arrival date & time 02/02/13  1438 History   First MD Initiated Contact with Patient 02/02/13 1506     Chief Complaint  Patient presents with  . disoriented    level V caveat to dementia. (Consider location/radiation/quality/duration/timing/severity/associated sxs/prior Treatment) The history is provided by the patient and the EMS personnel.   patient presents with altered mental status. Reportedly was on his power chair to the garage of a [employer] dealership. States he was worried about his daughter. He has a history of Alzheimer's. He is able to give me his name, but not the date. He knows that he is at the hospital, however he is not able to me which one.  Past Medical History  Diagnosis Date  . Hypertension   . Stroke   . Alzheimer disease   . Cancer     Lung   History reviewed. No pertinent past surgical history. History reviewed. No pertinent family history. History  Substance Use Topics  . Smoking status: Former Games developer  . Smokeless tobacco: Never Used  . Alcohol Use: No    Review of Systems  Unable to perform ROS: Dementia    Allergies  Review of patient's allergies indicates no known allergies.  Home Medications   Current Outpatient Rx  Name  Route  Sig  Dispense  Refill  . captopril (CAPOTEN) 50 MG tablet   Oral   Take 100 mg by mouth 2 (two) times daily.         . hydrochlorothiazide (HYDRODIURIL) 25 MG tablet   Oral   Take 25 mg by mouth daily.          BP 164/88  Pulse 100  Temp(Src) 98.5 F (36.9 C) (Oral)  Resp 18  SpO2 100% Physical Exam  Constitutional: He appears well-developed and well-nourished.  HENT:  Head: Atraumatic.  Eyes: Pupils are equal, round, and reactive to light.  Cardiovascular: Normal rate and regular rhythm.   Pulmonary/Chest: Effort normal and breath sounds normal.  Abdominal: Soft.  Musculoskeletal:  Right arm contracted  Neurological: He is alert.  Right arm contracted. Difficult to understand.  We'll follow commands. Alert and oriented to self.  Skin: Skin is warm.    ED Course  Procedures (including critical care time) Labs Review Labs Reviewed  CBC WITH DIFFERENTIAL - Abnormal; Notable for the following:    WBC 13.4 (*)    Eosinophils Relative 20 (*)    Neutro Abs 8.1 (*)    Eosinophils Absolute 2.7 (*)    All other components within normal limits  COMPREHENSIVE METABOLIC PANEL - Abnormal; Notable for the following:    Glucose, Bld 112 (*)    GFR calc non Af Amer 80 (*)    All other components within normal limits  URINALYSIS, ROUTINE W REFLEX MICROSCOPIC  ETHANOL  URINE RAPID DRUG SCREEN (HOSP PERFORMED)   Imaging Review Ct Head Wo Contrast  02/02/2013   CLINICAL DATA:  Altered mental status  EXAM: CT HEAD WITHOUT CONTRAST  TECHNIQUE: Contiguous axial images were obtained from the base of the skull through the vertex without intravenous contrast.  COMPARISON:  09/20/2012  FINDINGS: No hemorrhage or extra-axial fluid. Low attenuation in the periventricular white matter with chronic 13 mm lacunar infarct left basal ganglia. No vascular territory infarct.  IMPRESSION: No acute findings   Electronically Signed   By: Esperanza Heir M.D.   On: 02/02/2013 17:23   Dg Chest Port 1 View  02/02/2013   CLINICAL DATA:  Cough and congestion. Lung cancer.  EXAM: PORTABLE CHEST - 1 VIEW  COMPARISON:  Chest CT 12/18/2012  FINDINGS: Left apical mass is reidentified, with upward left hilar retraction. Mild cardiomegaly is identified. Prominence of the bilateral 1st costochondral junctions and bilateral glenohumeral joint degenerative change identified. No pleural effusions. Hyperlucency suggesting emphysema re-identified. No new osseous abnormality.  IMPRESSION: Left upper lobe mass compatible with the provided history of lung cancer reidentified. No new focal acute findings.   Electronically Signed   By: Christiana Pellant M.D.   On: 02/02/2013 15:56    MDM   1. Dementia with aggressive  behavior    Patient with acute on chronic dementia. Appears to medically cleared. Doubt intracranial metastasis as the cause of this. Patient has been seen by case management and social work. Daughter has been here also. Will be given home health. Family is attempting to find more care at home. If they feel they're unable to manage him or it is unsafe at home he will be transferred to a higher level of care.    Juliet Rude. Rubin Payor, MD 02/02/13 2132

## 2013-02-02 NOTE — ED Notes (Signed)
Pt daughter/emergency contact arrived at ED.  Is at nurses station stating she would like to have father discharged

## 2013-02-02 NOTE — ED Notes (Addendum)
Per pt request contacted Emergency contact and left vmail.  Attempted second emergency contact number but it rang busy.  Notified pt.

## 2013-02-03 ENCOUNTER — Encounter: Payer: Self-pay | Admitting: Vascular Surgery
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
## 2013-02-04 ENCOUNTER — Inpatient Hospital Stay (HOSPITAL_COMMUNITY): Admission: RE | Admit: 2013-02-04 | Payer: Medicare Other | Source: Ambulatory Visit

## 2013-02-04 ENCOUNTER — Encounter: Payer: Medicare Other | Admitting: Vascular Surgery

## 2013-02-04 NOTE — Progress Notes (Signed)
02/04/2013 A. Bennie Dallas RNCM [id number] [facility] called patient's daughter Konor Noren at [phone] in attempts to follow up with her regarding her father. Left voice message for Wille Glaser her if she had called private duty services or spoke to someone from [facility] and to call [facility] back to give an update.

## 2013-02-05 ENCOUNTER — Other Ambulatory Visit: Payer: Self-pay | Admitting: Internal Medicine

## 2013-02-05 NOTE — Progress Notes (Signed)
CM checked with [facility] staff to confirm pt's pcp is not listed as a doctor participating in [facility] program Pt is not a candidate for [facility]

## 2013-02-06 NOTE — Progress Notes (Signed)
[facility] ED CM left a voice message for Dr Everlene Other and/or r/t pt Left Cm contact number Dr Everlene Other returned the call and reports he has not seen pt since 12/24/12 and pt has not followed up since 02/02/13 ED visit.  Dr Everlene Other assisted with the powered w/c. Informed Dr Everlene Other of pt concerns, living status and attempts to for follow up for community services of [facility], thn. He will watch out for pt.  CM and Dr Everlene Other verified the only contact number is the [phone] He has no further number to reach pt also

## 2013-04-07 ENCOUNTER — Encounter: Payer: Self-pay | Admitting: Family Medicine

## 2013-04-07 DIAGNOSIS — I10 Essential (primary) hypertension: Secondary | ICD-10-CM | POA: Insufficient documentation

## 2013-04-15 ENCOUNTER — Emergency Department (HOSPITAL_COMMUNITY)
Admission: EM | Admit: 2013-04-15 | Discharge: 2013-04-15 | Disposition: A | Discharge status: Home / Self Care | Payer: Medicare (Managed Care) | Attending: Emergency Medicine | Admitting: Emergency Medicine

## 2013-04-15 ENCOUNTER — Encounter (HOSPITAL_COMMUNITY): Payer: Self-pay | Admitting: Emergency Medicine

## 2013-04-15 DIAGNOSIS — G309 Alzheimer's disease, unspecified: Secondary | ICD-10-CM | POA: Insufficient documentation

## 2013-04-15 DIAGNOSIS — Z8673 Personal history of transient ischemic attack (TIA), and cerebral infarction without residual deficits: Secondary | ICD-10-CM | POA: Insufficient documentation

## 2013-04-15 DIAGNOSIS — I10 Essential (primary) hypertension: Secondary | ICD-10-CM | POA: Insufficient documentation

## 2013-04-15 DIAGNOSIS — Z87891 Personal history of nicotine dependence: Secondary | ICD-10-CM | POA: Insufficient documentation

## 2013-04-15 DIAGNOSIS — Z85118 Personal history of other malignant neoplasm of bronchus and lung: Secondary | ICD-10-CM | POA: Insufficient documentation

## 2013-04-15 DIAGNOSIS — R4789 Other speech disturbances: Secondary | ICD-10-CM | POA: Insufficient documentation

## 2013-04-15 DIAGNOSIS — Z79899 Other long term (current) drug therapy: Secondary | ICD-10-CM | POA: Insufficient documentation

## 2013-04-15 DIAGNOSIS — F0280 Dementia in other diseases classified elsewhere without behavioral disturbance: Secondary | ICD-10-CM | POA: Insufficient documentation

## 2013-04-15 DIAGNOSIS — F0390 Unspecified dementia without behavioral disturbance: Secondary | ICD-10-CM

## 2013-04-15 NOTE — ED Notes (Signed)
Bed: WA09 Expected date:  Expected time:  Means of arrival:  Comments: 79 y/o M runny nose x 2 mo

## 2013-04-15 NOTE — ED Provider Notes (Signed)
CSN: [id number]     Arrival date & time 04/15/13  1758 History   First MD Initiated Contact with Patient 04/15/13 1807     Chief Complaint  Patient presents with  . Nasal Congestion    HPI  Patient presents here via EMS. He presented to [employer] in town. A difficulty understanding him and he was transferred here by EMS [employer] dealer called an ambulance. He has a history of marked dementia is with his daughter at night and on weekends and [facility] during the day. Apparently he left his home and his daughter was unaware. Shortly after his arrival the PA from patient the [facility] presents spoke with him and the daughter. He has no actual physical complaints or concerns at this time.  Past Medical History  Diagnosis Date  . Hypertension   . Stroke   . Alzheimer disease   . Cancer     Lung   History reviewed. No pertinent past surgical history. History reviewed. No pertinent family history. History  Substance Use Topics  . Smoking status: Former Games developer  . Smokeless tobacco: Never Used  . Alcohol Use: No    Review of Systems  Unable to perform ROS: Dementia    Allergies  Review of patient's allergies indicates no known allergies.  Home Medications   Current Outpatient Rx  Name  Route  Sig  Dispense  Refill  . captopril (CAPOTEN) 50 MG tablet   Oral   Take 100 mg by mouth 2 (two) times daily.         . hydrochlorothiazide (HYDRODIURIL) 25 MG tablet   Oral   Take 25 mg by mouth daily.         . traZODone (DESYREL) 50 MG tablet   Oral   Take 50 mg by mouth at bedtime.          BP 175/76  Pulse 88  Temp(Src) 97.5 F (36.4 C) (Oral)  Resp 17  SpO2 100% Physical Exam  Constitutional: He appears well-developed and well-nourished. No distress.  HENT:  Head: Normocephalic.  Eyes: Conjunctivae are normal. Pupils are equal, round, and reactive to light. No scleral icterus.  Neck: Normal range of motion. Neck supple. No  thyromegaly present.  Cardiovascular: Normal rate and regular rhythm.  Exam reveals no gallop and no friction rub.   No murmur heard. Pulmonary/Chest: Effort normal and breath sounds normal. No respiratory distress. He has no wheezes. He has no rales.  Abdominal: Soft. Bowel sounds are normal. He exhibits no distension. There is no tenderness. There is no rebound.  Musculoskeletal: Normal range of motion.  Neurological: He is alert.  Demented.  Poor memory. Rambling speech.  Skin: Skin is warm and dry. No rash noted.  Psychiatric: He has a normal mood and affect. His behavior is normal.    ED Course  Procedures (including critical care time) Labs Review Labs Reviewed - No data to display Imaging Review No results found.  EKG Interpretation   None       MDM   1. Dementia    Patient's daughter has arrived, and will be taking him home   Roney Marion, MD 04/15/13 2011

## 2013-04-15 NOTE — ED Notes (Addendum)
Per EMS, pt was picked up from [employer]. Pt drove his electronic wheelchair to dealership to call 911 to be treated at the hospital, because he does not have a phone at his home. Pt has a hx of altzheimer's disease. EMS reports the pt's chief complaint is a runny nose that has lasted for 1.5 months. Pt denies pain, nausea, vomiting.

## 2013-04-20 ENCOUNTER — Encounter: Payer: Self-pay | Admitting: Family Medicine

## 2013-04-20 ENCOUNTER — Other Ambulatory Visit: Payer: Self-pay | Admitting: Family Medicine

## 2013-04-20 DIAGNOSIS — I6990 Unspecified sequelae of unspecified cerebrovascular disease: Secondary | ICD-10-CM | POA: Insufficient documentation

## 2013-04-20 DIAGNOSIS — I10 Essential (primary) hypertension: Secondary | ICD-10-CM

## 2013-04-20 MED ORDER — LISINOPRIL 20 MG PO TABS: 20.0000 mg | ORAL_TABLET | Freq: Every day | ORAL | Status: DC

## 2013-06-22 ENCOUNTER — Ambulatory Visit (HOSPITAL_COMMUNITY): Payer: Medicare (Managed Care)

## 2013-06-25 ENCOUNTER — Ambulatory Visit: Admission: RE | Admit: 2013-06-25 | Payer: Medicare Other | Source: Ambulatory Visit | Admitting: Radiation Oncology

## 2013-07-02 ENCOUNTER — Ambulatory Visit
Admission: RE | Admit: 2013-07-02 | Payer: Medicare (Managed Care) | Source: Ambulatory Visit | Admitting: Radiation Oncology

## 2013-09-25 ENCOUNTER — Ambulatory Visit (INDEPENDENT_AMBULATORY_CARE_PROVIDER_SITE_OTHER): Payer: Medicare (Managed Care)

## 2013-09-25 VITALS — Ht 74.0 in | Wt 167.0 lb

## 2013-09-25 DIAGNOSIS — M79609 Pain in unspecified limb: Secondary | ICD-10-CM

## 2013-09-25 DIAGNOSIS — R269 Unspecified abnormalities of gait and mobility: Secondary | ICD-10-CM

## 2013-09-25 DIAGNOSIS — B351 Tinea unguium: Secondary | ICD-10-CM

## 2013-09-25 DIAGNOSIS — I739 Peripheral vascular disease, unspecified: Secondary | ICD-10-CM

## 2013-09-25 NOTE — Patient Instructions (Signed)

## 2013-09-25 NOTE — Progress Notes (Signed)
   Subjective:    Patient ID: William Walters, male    DOB: 1933-03-08, 80 y.o.   MRN: [id number]  HPI Comments: N debridement L 1 - 10 toenails D and O long-term C elongated, thick toenails  A dementia, difficulty with cutting T none     Review of Systems  Cardiovascular: Positive for chest pain and leg swelling.  All other systems reviewed and are negative.      Objective:   Physical Exam 80 year old [place] American male presents this time appears to be somewhat malnourished or slight mildly disoriented with some dementia otherwise presenting for foot and nail care with painful thick and overgrown hypertrophic nails. Patient is minimally ambulatory at this time.  Lower extremity objective findings as follows vascular status is grossly diminished with nonpalpable are thready pulses at best DP one over 4 PT nonpalpable bilateral capillary refill time 4-5 seconds all digits skin temperature warm to cool turgor diminished dry scaling fissure skin more so on right and left nails thick brittle crumbly friable discolored tender and touchy on palpation and with enclosed shoe wear there is some keratoses distal tuft of the first bilateral lateral first digit IP joint left foot there is digital contractures semirigid contractures of toes 2345 contracture the hallux as well patient has decreased muscle tone and function evaluation DTRs not elicited there is normal plantar response although very weakened no active open wounds or ulcerations noted this time no secondary infection is noted patient wearing accommodative shoe or slipper and socks at all times.       Assessment & Plan:  Assessment patient is a history of PVD history of stroke and CVA with gait abnormality and muscle weakness and wasting of both feet and legs. There is a rigid digital contractures noted with multiple dystrophic friable criptotic nails painful yellow discolored mycotic nails 1 through 5 bilateral debridement at this time  return for future palliative care return 3 months months or as needed. Maintain accommodative shoes at all times  Harriet Masson DPM

## 2015-01-28 ENCOUNTER — Ambulatory Visit
Admission: RE | Admit: 2015-01-28 | Discharge: 2015-01-28 | Disposition: A | Discharge status: Home / Self Care | Payer: No Typology Code available for payment source | Source: Ambulatory Visit | Attending: Family Medicine | Admitting: Family Medicine

## 2015-01-28 ENCOUNTER — Other Ambulatory Visit: Payer: Self-pay | Admitting: Family Medicine

## 2015-01-28 DIAGNOSIS — R0989 Other specified symptoms and signs involving the circulatory and respiratory systems: Secondary | ICD-10-CM

## 2015-05-10 ENCOUNTER — Ambulatory Visit: Payer: Medicare (Managed Care) | Admitting: Podiatry

## 2015-05-20 ENCOUNTER — Ambulatory Visit: Payer: Medicare (Managed Care) | Admitting: Podiatry

## 2015-05-27 ENCOUNTER — Encounter: Payer: Self-pay | Admitting: Podiatry

## 2015-05-27 ENCOUNTER — Ambulatory Visit (INDEPENDENT_AMBULATORY_CARE_PROVIDER_SITE_OTHER): Payer: Medicare (Managed Care)

## 2015-05-27 ENCOUNTER — Ambulatory Visit (INDEPENDENT_AMBULATORY_CARE_PROVIDER_SITE_OTHER): Payer: Medicare (Managed Care) | Admitting: Podiatry

## 2015-05-27 VITALS — BP 161/89 | HR 81 | Resp 16

## 2015-05-27 DIAGNOSIS — E119 Type 2 diabetes mellitus without complications: Secondary | ICD-10-CM

## 2015-05-27 DIAGNOSIS — M79676 Pain in unspecified toe(s): Secondary | ICD-10-CM

## 2015-05-27 DIAGNOSIS — L89891 Pressure ulcer of other site, stage 1: Secondary | ICD-10-CM | POA: Diagnosis not present

## 2015-05-27 DIAGNOSIS — I7025 Atherosclerosis of native arteries of other extremities with ulceration: Secondary | ICD-10-CM

## 2015-05-27 DIAGNOSIS — B351 Tinea unguium: Secondary | ICD-10-CM

## 2015-05-27 DIAGNOSIS — I739 Peripheral vascular disease, unspecified: Secondary | ICD-10-CM

## 2015-05-28 NOTE — Progress Notes (Addendum)
Subjective:     Patient ID: William Walters, male   DOB: October 16, 1932, 82 y.o.   MRN: [id number]  HPI this patient presents to the office with chief complaint of painful long thick nails, as well as a wound on his third toe, right foot. The nails are thick disfigured discolored and are painful in shoes. Examination of the third toe does reveal pain redness and skin lesion noted on the top of the  toe, right foot.  He experiences pain to the toe and I am unable to touch the toe due to the level of pain. This patient was dropped off by [facility]  he states and is unable to communicate with me at this time.He presents for evaluation and treatment.   Review of Systems     Objective:   Physical Exam GENERAL APPEARANCE: Alert, conversant. Appropriately groomed. No acute distress.  VASCULAR: Pedal pulses are  palpable at  [facility] and PT bilateral.  Capillary refill time is diminished  to all digits,  Cold feet noted.Marland Kitchen   NEUROLOGIC: sensation is normal to 5.07 monofilament at 5/5 sites bilateral.  Light touch is intact bilateral, Muscle strength normal.  MUSCULOSKELETAL:  Rectus feet noted with hammering digits B/L. His right foot is significantly red and swollen compared to his left foot.Severe HAV 1st MPJ B/L  DERMATOLOGIC: .His third toe right has loss of skin dorsally ulceration of dorsal skin.  There is black eschar noted dorsally at site of ulceration.  Necrotic tissue covering his ulcer with no pus noted.  NAILS  Thick disfigured discolored nails noted in all digits. No evidence of infection noted to nails.    Assessment:     Onychomycosis  Ulcer secondary to vascular compromise.      Plan:     ROV  X-rays taken revealing osteoporosis to all bone third toe and cannot determine of OM is present.  Neosporin/DSD>  Home soaks recommended.  This patient needs to be reevaluated by Dr. Jacqualyn Posey next week concerning his vascular status and his ulcerated third toe right foot.   Gardiner Barefoot DPM

## 2015-06-01 ENCOUNTER — Other Ambulatory Visit: Payer: Self-pay

## 2015-06-01 ENCOUNTER — Encounter: Payer: Self-pay | Admitting: Vascular Surgery

## 2015-06-01 ENCOUNTER — Ambulatory Visit (HOSPITAL_COMMUNITY)
Admission: RE | Admit: 2015-06-01 | Discharge: 2015-06-01 | Disposition: A | Discharge status: Home / Self Care | Payer: Medicare (Managed Care) | Source: Ambulatory Visit | Attending: Vascular Surgery | Admitting: Vascular Surgery

## 2015-06-01 ENCOUNTER — Ambulatory Visit (INDEPENDENT_AMBULATORY_CARE_PROVIDER_SITE_OTHER): Payer: Medicare (Managed Care) | Admitting: Vascular Surgery

## 2015-06-01 VITALS — BP 132/78 | HR 76 | Temp 98.1°F | Resp 16 | Ht 74.0 in | Wt 167.0 lb

## 2015-06-01 DIAGNOSIS — I10 Essential (primary) hypertension: Secondary | ICD-10-CM | POA: Diagnosis not present

## 2015-06-01 DIAGNOSIS — I7025 Atherosclerosis of native arteries of other extremities with ulceration: Secondary | ICD-10-CM | POA: Diagnosis not present

## 2015-06-01 DIAGNOSIS — I998 Other disorder of circulatory system: Secondary | ICD-10-CM

## 2015-06-01 DIAGNOSIS — R938 Abnormal findings on diagnostic imaging of other specified body structures: Secondary | ICD-10-CM | POA: Diagnosis not present

## 2015-06-01 NOTE — Progress Notes (Signed)
[facility] [place]  Patient name: William Walters MRN: [id number] DOB: 1932/09/18 Sex: male  REASON FOR CONSULT: Ischemic right foot. Referred by Dr. Bradd Burner.  HPI: William Walters is a 82 y.o. male, who is nonambulatory. He has presented with progressive ischemia of the right lower extremity. Of note he lives at home with his daughter that stays at the [facility] daycare center during the day. He has been nonambulatory and is in a wheelchair. He has a history of significant dementia. He does describe pain in his right foot which is fairly constant. I cannot obtain any reliable history from the patient.  I have reviewed the records that were sent with the patient. He does have a history of vascular dementia with behavioral disturbances including delusions and insomnia. He has a history of lung cancer and is status post radiation therapy. He also has moderate protein calorie malnutrition with anorexia and weight loss.  Past Medical History  Diagnosis Date  . Hypertension   . Stroke ([place])   . Alzheimer disease   . Cancer [facility])     Lung    History reviewed. No pertinent family history.  SOCIAL HISTORY: Social History   Social History  . Marital Status: Single    Spouse Name: N/A  . Number of Children: N/A  . Years of Education: N/A   Occupational History  . Not on file.   Social History Main Topics  . Smoking status: Former Research scientist (life sciences)  . Smokeless tobacco: Never Used  . Alcohol Use: No  . Drug Use: No  . Sexual Activity: Not on file   Other Topics Concern  . Not on file   Social History Narrative    No Known Allergies  Current Outpatient Prescriptions  Medication Sig Dispense Refill  . Cholecalciferol (VITAMIN D-3 PO) Take 50,000 Units by mouth every 30 (thirty) days.    Marland Kitchen donepezil (ARICEPT) 10 MG tablet Take 10 mg by mouth at bedtime.    . hydrochlorothiazide (HYDRODIURIL) 25 MG tablet Take 25 mg by mouth daily.    Marland Kitchen lisinopril (PRINIVIL,ZESTRIL) 20 MG  tablet Take 1 tablet (20 mg total) by mouth daily.    . polyethylene glycol powder (MIRALAX) powder Take 1 Container by mouth once.    . traZODone (DESYREL) 50 MG tablet Take 50 mg by mouth at bedtime.     No current facility-administered medications for this visit.    REVIEW OF SYSTEMS: The patient was unable to complete his review of systems. '[X]'$  denotes positive finding, '[ ]'$  denotes negative finding Cardiac  Comments:  Chest pain or chest pressure:    Shortness of breath upon exertion:    Short of breath when lying flat:    Irregular heart rhythm:        Vascular    Pain in calf, thigh, or hip brought on by ambulation:    Pain in feet at night that wakes you up from your sleep:     Blood clot in your veins:    Leg swelling:         Pulmonary    Oxygen at home:    Productive cough:     Wheezing:         Neurologic    Sudden weakness in arms or legs:     Sudden numbness in arms or legs:     Sudden onset of difficulty speaking or slurred speech:    Temporary loss of vision in one eye:     Problems with  dizziness:         Gastrointestinal    Blood in stool:     Vomited blood:         Genitourinary    Burning when urinating:     Blood in urine:        Psychiatric    Major depression:         Hematologic    Bleeding problems:    Problems with blood clotting too easily:        Skin    Rashes or ulcers:        Constitutional    Fever or chills:      PHYSICAL EXAM: Filed Vitals:   06/01/15 1038  BP: 132/78  Pulse: 76  Temp: 98.1 F (36.7 C)  TempSrc: Oral  Resp: 16  Height: '6\' 2"'$  (1.88 m)  Weight: 167 lb (75.751 kg)  SpO2: 100%    GENERAL: The patient is a well-nourished male, in no acute distress. The vital signs are documented above. CARDIAC: There is a regular rate and rhythm.  VASCULAR: I do not detect carotid bruits. He has palpable femoral pulses. I cannot palpate popliteal or pedal pulses. PULMONARY: There is good air exchange bilaterally  without wheezing or rales. ABDOMEN: Soft and non-tender with normal pitched bowel sounds.  MUSCULOSKELETAL: There are no major deformities or cyanosis. He has a contracture of his right knee. NEUROLOGIC: No focal weakness or paresthesias are detected. SKIN: He has dry gangrene of the right third toe on the dorsum of the toe and a smaller wound on the adjacent fourth toe. PSYCHIATRIC: The patient has a normal affect.  DATA:   LOWER EXTREMITY ARTERIAL DOPPLER STUDY: I have independently interpreted his lower extremity arterial Doppler study. On the right side there is a monophasic anterior tibial signal only. ABI is 49%. On the left side, there is a monophasic posterior tibial signal and dorsalis pedis signal. ABI is 63%.  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 He has nonhealing wounds of the right foot. Given his dementia, non-ambulatory status, severe protein malnutrition, and other medical comorbidities, I do not think he is a candidate for revascularization. I have recommended a right above-the-knee amputation if the family is agreeable. They are not present today. Given the contracture of the knee as he no advantage to doing it below the knee especially given that he is nonambulatory. We can arrange for a right above-the-knee amputation if the family is agreeable.   [name] [facility] [place]: [phone]

## 2015-06-03 ENCOUNTER — Ambulatory Visit: Payer: Medicare (Managed Care) | Admitting: Podiatry

## 2015-06-03 ENCOUNTER — Encounter: Payer: Self-pay | Admitting: Vascular Surgery

## 2015-06-06 ENCOUNTER — Other Ambulatory Visit: Payer: Self-pay | Admitting: *Deleted

## 2015-06-08 ENCOUNTER — Encounter (HOSPITAL_COMMUNITY): Payer: Self-pay | Admitting: *Deleted

## 2015-06-08 MED ORDER — DEXTROSE 5 % IV SOLN
1.5000 g | INTRAVENOUS | Status: AC
  Administered 2015-06-09: 1.5 g via INTRAVENOUS
  Filled 2015-06-08: qty 1.5

## 2015-06-08 NOTE — Progress Notes (Signed)
Seth Bake, pt daughter completing SDW-Pre-op call denies that pt had a stress test, echo and cardiac cath. Anrea also denies that pt had an EKG within the last year.

## 2015-06-08 NOTE — Progress Notes (Signed)
Please measure pt neck DOS to complete Apnea screening.

## 2015-06-08 NOTE — Pre-Procedure Instructions (Signed)
    Yani Lal  06/08/2015      [facility] [id number] - [address] [place] [address] Phone: [phone] Fax: [phone]    Your procedure is scheduled on Thursday, June 09, 2015  Report to [facility] Admitting at 10:00 A.M.  Call this number if you have problems the morning of surgery:  [phone]   Remember:  Do not eat food or drink liquids after midnight.  Take these medicines the morning of surgery with A SIP OF WATER : None  Stop taking vitamins, fish oil and herbal medications. Do not take any NSAIDs ie: Ibuprofen, Advil, Naproxen or any medication containing Aspirin; stop now.  Do not wear jewelry, make-up or nail polish.  Do not wear lotions, powders, or perfumes.  You may not wear deodorant.  Do not shave 48 hours prior to surgery.  Men may shave face and neck.  Do not bring valuables to the hospital.  [facility] is not responsible for any belongings or valuables.  Contacts, dentures or bridgework may not be worn into surgery.  Leave your suitcase in the car.  After surgery it may be brought to your room.  For patients admitted to the hospital, discharge time will be determined by your treatment team.  Patients discharged the day of surgery will not be allowed to drive home.   Name and phone number of your driver:

## 2015-06-08 NOTE — Progress Notes (Signed)
Pt SDW-Pre-op call completed by pt daughter and POA (per Davy Pique, care provider at [facility]), Darrol Jump. Pt lives with daughter Seth Bake however, pt receives services at [facility]. Pt instructions were also faxed to [facility], care provider. Davy Pique stated that pt instructions will be sent home with pt for Seth Bake to review. When completing pt anesthesia assessment Seth Bake stated, " he didn't have any complications that I know of. " Seth Bake made aware to have pt stop taking otc vitamins, fish oil and herbal medications. Do not take any NSAIDs ie: Ibuprofen, Advil, Naproxen or any medication containing Aspirin. Seth Bake verbalized understanding of all pre-op instructions.

## 2015-06-08 NOTE — Progress Notes (Signed)
Sonya, care staff at [facility] confirmed receipt of pt pre-op instructions. Sonya to forward pt instructions to pt primary nurse, Nevin Bloodgood.

## 2015-06-09 ENCOUNTER — Encounter (HOSPITAL_COMMUNITY)
Admission: AD | Disposition: A | Discharge status: Skilled Nursing Facility | Payer: Self-pay | Source: Ambulatory Visit | Attending: Vascular Surgery

## 2015-06-09 ENCOUNTER — Inpatient Hospital Stay (HOSPITAL_COMMUNITY): Payer: Medicare (Managed Care) | Admitting: Certified Registered Nurse Anesthetist

## 2015-06-09 ENCOUNTER — Encounter (HOSPITAL_COMMUNITY): Payer: Self-pay | Admitting: Certified Registered Nurse Anesthetist

## 2015-06-09 ENCOUNTER — Inpatient Hospital Stay (HOSPITAL_COMMUNITY)
Admission: AD | Admit: 2015-06-09 | Discharge: 2015-06-13 | DRG: 239 | Disposition: A | Discharge status: Skilled Nursing Facility | Payer: Medicare (Managed Care) | Source: Ambulatory Visit | Attending: Vascular Surgery | Admitting: Vascular Surgery

## 2015-06-09 ENCOUNTER — Inpatient Hospital Stay (HOSPITAL_COMMUNITY): Payer: Medicare (Managed Care)

## 2015-06-09 DIAGNOSIS — M79671 Pain in right foot: Secondary | ICD-10-CM | POA: Diagnosis present

## 2015-06-09 DIAGNOSIS — Z923 Personal history of irradiation: Secondary | ICD-10-CM

## 2015-06-09 DIAGNOSIS — E43 Unspecified severe protein-calorie malnutrition: Secondary | ICD-10-CM | POA: Diagnosis present

## 2015-06-09 DIAGNOSIS — I998 Other disorder of circulatory system: Secondary | ICD-10-CM | POA: Diagnosis present

## 2015-06-09 DIAGNOSIS — M24569 Contracture, unspecified knee: Secondary | ICD-10-CM | POA: Diagnosis present

## 2015-06-09 DIAGNOSIS — I70261 Atherosclerosis of native arteries of extremities with gangrene, right leg: Principal | ICD-10-CM | POA: Diagnosis present

## 2015-06-09 DIAGNOSIS — G4700 Insomnia, unspecified: Secondary | ICD-10-CM | POA: Diagnosis present

## 2015-06-09 DIAGNOSIS — Z8673 Personal history of transient ischemic attack (TIA), and cerebral infarction without residual deficits: Secondary | ICD-10-CM | POA: Diagnosis not present

## 2015-06-09 DIAGNOSIS — Z85118 Personal history of other malignant neoplasm of bronchus and lung: Secondary | ICD-10-CM

## 2015-06-09 DIAGNOSIS — I10 Essential (primary) hypertension: Secondary | ICD-10-CM | POA: Diagnosis present

## 2015-06-09 DIAGNOSIS — F0280 Dementia in other diseases classified elsewhere without behavioral disturbance: Secondary | ICD-10-CM | POA: Diagnosis present

## 2015-06-09 DIAGNOSIS — F22 Delusional disorders: Secondary | ICD-10-CM | POA: Diagnosis present

## 2015-06-09 DIAGNOSIS — I96 Gangrene, not elsewhere classified: Secondary | ICD-10-CM

## 2015-06-09 DIAGNOSIS — F0151 Vascular dementia with behavioral disturbance: Secondary | ICD-10-CM | POA: Diagnosis present

## 2015-06-09 DIAGNOSIS — Z419 Encounter for procedure for purposes other than remedying health state, unspecified: Secondary | ICD-10-CM

## 2015-06-09 DIAGNOSIS — G309 Alzheimer's disease, unspecified: Secondary | ICD-10-CM | POA: Diagnosis present

## 2015-06-09 DIAGNOSIS — L97519 Non-pressure chronic ulcer of other part of right foot with unspecified severity: Secondary | ICD-10-CM | POA: Diagnosis present

## 2015-06-09 HISTORY — DX: Headache: R51

## 2015-06-09 HISTORY — PX: AMPUTATION: SHX166

## 2015-06-09 HISTORY — DX: Headache, unspecified: R51.9

## 2015-06-09 LAB — BASIC METABOLIC PANEL
Anion gap: 10 (ref 5–15)
BUN: 14 mg/dL (ref 6–20)
CALCIUM: 9.3 mg/dL (ref 8.9–10.3)
CHLORIDE: 106 mmol/L (ref 101–111)
CO2: 25 mmol/L (ref 22–32)
CREATININE: 1.03 mg/dL (ref 0.61–1.24)
GFR calc Af Amer: >60 mL/min (ref >60)
GFR calc non Af Amer: >60 mL/min (ref >60)
GLUCOSE: 104 mg/dL — AB (ref 65–99)
Potassium: 4.4 mmol/L (ref 3.5–5.1)
Sodium: 141 mmol/L (ref 135–145)

## 2015-06-09 LAB — CREATININE, SERUM
Creatinine, Ser: 1.03 mg/dL (ref 0.61–1.24)
GFR calc Af Amer: >60 mL/min (ref >60)
GFR calc non Af Amer: >60 mL/min (ref >60)

## 2015-06-09 LAB — CBC
HEMATOCRIT: 39.7 % (ref 39.0–52.0)
HEMATOCRIT: 36.5 % — AB (ref 39.0–52.0)
Hemoglobin: 13.1 g/dL (ref 13.0–17.0)
Hemoglobin: 11.7 g/dL — ABNORMAL LOW (ref 13.0–17.0)
MCH: 30.4 pg (ref 26.0–34.0)
MCH: 29.4 pg (ref 26.0–34.0)
MCHC: 33 g/dL (ref 30.0–36.0)
MCHC: 32.1 g/dL (ref 30.0–36.0)
MCV: 92.1 fL (ref 78.0–100.0)
MCV: 91.7 fL (ref 78.0–100.0)
PLATELETS: 162 10*3/uL (ref 150–400)
Platelets: 183 10*3/uL (ref 150–400)
RBC: 4.31 MIL/uL (ref 4.22–5.81)
RBC: 3.98 MIL/uL — ABNORMAL LOW (ref 4.22–5.81)
RDW: 14.9 % (ref 11.5–15.5)
RDW: 14.8 % (ref 11.5–15.5)
WBC: 7.8 10*3/uL (ref 4.0–10.5)
WBC: 10.2 10*3/uL (ref 4.0–10.5)

## 2015-06-09 LAB — SURGICAL PCR SCREEN
MRSA, PCR: NEGATIVE
Staphylococcus aureus: NEGATIVE

## 2015-06-09 SURGERY — AMPUTATION, ABOVE KNEE
Anesthesia: General | Site: Leg Upper | Laterality: Right

## 2015-06-09 MED ORDER — ONDANSETRON HCL 4 MG/2ML IJ SOLN: 4.0000 mg | Freq: Four times a day (QID) | INTRAMUSCULAR | Status: DC | PRN

## 2015-06-09 MED ORDER — LISINOPRIL 10 MG PO TABS
20.0000 mg | ORAL_TABLET | Freq: Every day | ORAL | Status: DC
  Administered 2015-06-09 – 2015-06-13 (×5): 20 mg via ORAL
  Filled 2015-06-09 (×5): qty 2

## 2015-06-09 MED ORDER — MEMANTINE HCL 10 MG PO TABS
10.0000 mg | ORAL_TABLET | Freq: Two times a day (BID) | ORAL | Status: DC
  Administered 2015-06-09 – 2015-06-13 (×8): 10 mg via ORAL
  Filled 2015-06-09 (×8): qty 1

## 2015-06-09 MED ORDER — SODIUM CHLORIDE 0.9 % IV SOLN: INTRAVENOUS | Status: DC

## 2015-06-09 MED ORDER — CHLORHEXIDINE GLUCONATE CLOTH 2 % EX PADS: 6.0000 | MEDICATED_PAD | Freq: Once | CUTANEOUS | Status: DC

## 2015-06-09 MED ORDER — ACETAMINOPHEN 650 MG RE SUPP: 325.0000 mg | RECTAL | Status: DC | PRN

## 2015-06-09 MED ORDER — ONDANSETRON HCL 4 MG/2ML IJ SOLN
INTRAMUSCULAR | Status: AC
Start: 2015-06-09 — End: 2015-06-09
  Filled 2015-06-09: qty 2

## 2015-06-09 MED ORDER — LACTATED RINGERS IV SOLN
INTRAVENOUS | Status: DC | PRN
  Administered 2015-06-09 (×2): via INTRAVENOUS

## 2015-06-09 MED ORDER — FENTANYL CITRATE (PF) 100 MCG/2ML IJ SOLN
INTRAMUSCULAR | Status: DC | PRN
  Administered 2015-06-09 (×2): 25 ug via INTRAVENOUS

## 2015-06-09 MED ORDER — MUPIROCIN 2 % EX OINT
1.0000 "application " | TOPICAL_OINTMENT | Freq: Once | CUTANEOUS | Status: AC
  Administered 2015-06-09: 1 via TOPICAL

## 2015-06-09 MED ORDER — PHENOL 1.4 % MT LIQD
1.0000 | OROMUCOSAL | Status: DC | PRN
Start: 2015-06-09 — End: 2015-06-13

## 2015-06-09 MED ORDER — BISACODYL 10 MG RE SUPP: 10.0000 mg | Freq: Every day | RECTAL | Status: DC | PRN

## 2015-06-09 MED ORDER — DONEPEZIL HCL 10 MG PO TABS
10.0000 mg | ORAL_TABLET | Freq: Every day | ORAL | Status: DC
Start: 2015-06-09 — End: 2015-06-13
  Administered 2015-06-09 – 2015-06-12 (×4): 10 mg via ORAL
  Filled 2015-06-09 (×4): qty 1

## 2015-06-09 MED ORDER — DEXTROSE 5 % IV SOLN
1.5000 g | Freq: Two times a day (BID) | INTRAVENOUS | Status: AC
  Administered 2015-06-10 (×2): 1.5 g via INTRAVENOUS
  Filled 2015-06-09 (×2): qty 1.5

## 2015-06-09 MED ORDER — PANTOPRAZOLE SODIUM 40 MG PO TBEC
40.0000 mg | DELAYED_RELEASE_TABLET | Freq: Every day | ORAL | Status: DC
  Administered 2015-06-09 – 2015-06-13 (×5): 40 mg via ORAL
  Filled 2015-06-09 (×5): qty 1

## 2015-06-09 MED ORDER — EPHEDRINE SULFATE 50 MG/ML IJ SOLN
INTRAMUSCULAR | Status: DC | PRN
  Administered 2015-06-09: 10 mg via INTRAVENOUS

## 2015-06-09 MED ORDER — MUPIROCIN 2 % EX OINT
TOPICAL_OINTMENT | CUTANEOUS | Status: AC
  Filled 2015-06-09: qty 22

## 2015-06-09 MED ORDER — METOPROLOL TARTRATE 1 MG/ML IV SOLN: 2.0000 mg | INTRAVENOUS | Status: DC | PRN

## 2015-06-09 MED ORDER — POTASSIUM CHLORIDE CRYS ER 20 MEQ PO TBCR: 20.0000 meq | EXTENDED_RELEASE_TABLET | Freq: Every day | ORAL | Status: DC | PRN

## 2015-06-09 MED ORDER — FENTANYL CITRATE (PF) 250 MCG/5ML IJ SOLN
INTRAMUSCULAR | Status: AC
  Filled 2015-06-09: qty 5

## 2015-06-09 MED ORDER — HYDROMORPHONE HCL 1 MG/ML IJ SOLN
0.2500 mg | INTRAMUSCULAR | Status: DC | PRN
  Administered 2015-06-09: 0.25 mg via INTRAVENOUS

## 2015-06-09 MED ORDER — ENSURE PLUS PO LIQD: 237.0000 mL | Freq: Two times a day (BID) | ORAL | Status: DC

## 2015-06-09 MED ORDER — 0.9 % SODIUM CHLORIDE (POUR BTL) OPTIME
TOPICAL | Status: DC | PRN
  Administered 2015-06-09: 1000 mL

## 2015-06-09 MED ORDER — DOCUSATE SODIUM 100 MG PO CAPS
100.0000 mg | ORAL_CAPSULE | Freq: Every day | ORAL | Status: DC
  Administered 2015-06-10 – 2015-06-13 (×4): 100 mg via ORAL
  Filled 2015-06-09 (×4): qty 1

## 2015-06-09 MED ORDER — ONDANSETRON HCL 4 MG/2ML IJ SOLN
INTRAMUSCULAR | Status: DC | PRN
Start: 2015-06-09 — End: 2015-06-09
  Administered 2015-06-09: 4 mg via INTRAVENOUS

## 2015-06-09 MED ORDER — GUAIFENESIN-DM 100-10 MG/5ML PO SYRP: 15.0000 mL | ORAL_SOLUTION | ORAL | Status: DC | PRN

## 2015-06-09 MED ORDER — PHENYLEPHRINE HCL 10 MG/ML IJ SOLN
INTRAMUSCULAR | Status: DC | PRN
  Administered 2015-06-09: 120 ug via INTRAVENOUS
  Administered 2015-06-09 (×2): 80 ug via INTRAVENOUS
  Administered 2015-06-09: 120 ug via INTRAVENOUS

## 2015-06-09 MED ORDER — HYDROMORPHONE HCL 1 MG/ML IJ SOLN
INTRAMUSCULAR | Status: AC
  Filled 2015-06-09: qty 1

## 2015-06-09 MED ORDER — OXYCODONE HCL 5 MG PO TABS
5.0000 mg | ORAL_TABLET | ORAL | Status: DC | PRN
  Administered 2015-06-09 – 2015-06-10 (×2): 5 mg via ORAL
  Filled 2015-06-09 (×2): qty 1

## 2015-06-09 MED ORDER — BACITRACIN ZINC 500 UNIT/GM EX OINT
TOPICAL_OINTMENT | CUTANEOUS | Status: AC
  Filled 2015-06-09: qty 28.35

## 2015-06-09 MED ORDER — SODIUM CHLORIDE 0.9 % IV SOLN
INTRAVENOUS | Status: DC
  Administered 2015-06-09 – 2015-06-13 (×7): via INTRAVENOUS

## 2015-06-09 MED ORDER — ACETAMINOPHEN 325 MG PO TABS
325.0000 mg | ORAL_TABLET | ORAL | Status: DC | PRN
Start: 2015-06-09 — End: 2015-06-13
  Administered 2015-06-10: 650 mg via ORAL
  Administered 2015-06-11: 325 mg via ORAL
  Filled 2015-06-09 (×2): qty 2

## 2015-06-09 MED ORDER — LABETALOL HCL 5 MG/ML IV SOLN: 10.0000 mg | INTRAVENOUS | Status: DC | PRN

## 2015-06-09 MED ORDER — ENSURE ENLIVE PO LIQD
237.0000 mL | Freq: Two times a day (BID) | ORAL | Status: DC
  Administered 2015-06-11 – 2015-06-13 (×3): 237 mL via ORAL

## 2015-06-09 MED ORDER — TRAZODONE HCL 50 MG PO TABS
50.0000 mg | ORAL_TABLET | Freq: Every day | ORAL | Status: DC
  Administered 2015-06-09 – 2015-06-12 (×4): 50 mg via ORAL
  Filled 2015-06-09 (×4): qty 1

## 2015-06-09 MED ORDER — PROPOFOL 10 MG/ML IV BOLUS
INTRAVENOUS | Status: AC
  Filled 2015-06-09: qty 20

## 2015-06-09 MED ORDER — BACITRACIN ZINC 500 UNIT/GM EX OINT
TOPICAL_OINTMENT | CUTANEOUS | Status: DC | PRN
  Administered 2015-06-09: 1 via TOPICAL

## 2015-06-09 MED ORDER — ENOXAPARIN SODIUM 30 MG/0.3ML ~~LOC~~ SOLN
30.0000 mg | SUBCUTANEOUS | Status: DC
  Administered 2015-06-10 – 2015-06-12 (×3): 30 mg via SUBCUTANEOUS
  Filled 2015-06-09 (×3): qty 0.3

## 2015-06-09 MED ORDER — ALUM & MAG HYDROXIDE-SIMETH 200-200-20 MG/5ML PO SUSP: 15.0000 mL | ORAL | Status: DC | PRN

## 2015-06-09 MED ORDER — MORPHINE SULFATE (PF) 2 MG/ML IV SOLN: 2.0000 mg | INTRAVENOUS | Status: DC | PRN

## 2015-06-09 MED ORDER — LIDOCAINE HCL (CARDIAC) 20 MG/ML IV SOLN
INTRAVENOUS | Status: DC | PRN
  Administered 2015-06-09: 100 mg via INTRAVENOUS

## 2015-06-09 MED ORDER — LACTATED RINGERS IV SOLN
INTRAVENOUS | Status: DC
  Administered 2015-06-09: 10:00:00 via INTRAVENOUS

## 2015-06-09 MED ORDER — PROPOFOL 10 MG/ML IV BOLUS
INTRAVENOUS | Status: DC | PRN
  Administered 2015-06-09: 100 mg via INTRAVENOUS
  Administered 2015-06-09: 30 mg via INTRAVENOUS

## 2015-06-09 MED ORDER — DEXTROSE 5 % IV SOLN
10.0000 mg | INTRAVENOUS | Status: DC | PRN
  Administered 2015-06-09: 20 ug/min via INTRAVENOUS

## 2015-06-09 MED ORDER — ROCURONIUM BROMIDE 50 MG/5ML IV SOLN
INTRAVENOUS | Status: AC
  Filled 2015-06-09: qty 1

## 2015-06-09 MED ORDER — POLYETHYLENE GLYCOL 3350 17 G PO PACK: 17.0000 g | PACK | Freq: Every day | ORAL | Status: DC | PRN

## 2015-06-09 MED ORDER — HYDRALAZINE HCL 20 MG/ML IJ SOLN: 5.0000 mg | INTRAMUSCULAR | Status: DC | PRN

## 2015-06-09 SURGICAL SUPPLY — 52 items
BANDAGE ELASTIC 6 VELCRO ST LF (GAUZE/BANDAGES/DRESSINGS) ×3 IMPLANT
BANDAGE ESMARK 6X9 LF (GAUZE/BANDAGES/DRESSINGS) ×1 IMPLANT
BLADE SAW RECIP 87.9 MT (BLADE) ×3 IMPLANT
BLADE SURG 10 STRL SS (BLADE) ×3 IMPLANT
BNDG COHESIVE 6X5 TAN STRL LF (GAUZE/BANDAGES/DRESSINGS) ×3 IMPLANT
BNDG ESMARK 6X9 LF (GAUZE/BANDAGES/DRESSINGS) ×3
BNDG GAUZE ELAST 4 BULKY (GAUZE/BANDAGES/DRESSINGS) ×3 IMPLANT
CANISTER SUCTION 2500CC (MISCELLANEOUS) ×3 IMPLANT
CLIP TI MEDIUM 6 (CLIP) ×3 IMPLANT
COVER SURGICAL LIGHT HANDLE (MISCELLANEOUS) ×3 IMPLANT
CUFF TOURNIQUET SINGLE 24IN (TOURNIQUET CUFF) ×3 IMPLANT
DRAIN CHANNEL 19F RND (DRAIN) IMPLANT
DRAPE ORTHO SPLIT 77X108 STRL (DRAPES) ×4
DRAPE PROXIMA HALF (DRAPES) ×3 IMPLANT
DRAPE SURG ORHT 6 SPLT 77X108 (DRAPES) ×2 IMPLANT
DRAPE U-SHAPE 47X51 STRL (DRAPES) ×3 IMPLANT
DRSG ADAPTIC 3X8 NADH LF (GAUZE/BANDAGES/DRESSINGS) ×3 IMPLANT
ELECT REM PT RETURN 9FT ADLT (ELECTROSURGICAL) ×3
ELECTRODE REM PT RTRN 9FT ADLT (ELECTROSURGICAL) ×1 IMPLANT
EVACUATOR SILICONE 100CC (DRAIN) IMPLANT
GAUZE SPONGE 4X4 12PLY STRL (GAUZE/BANDAGES/DRESSINGS) ×3 IMPLANT
GLOVE BIO SURGEON STRL SZ 6.5 (GLOVE) ×4 IMPLANT
GLOVE BIO SURGEON STRL SZ7.5 (GLOVE) ×3 IMPLANT
GLOVE BIO SURGEONS STRL SZ 6.5 (GLOVE) ×2
GLOVE BIOGEL PI IND STRL 6.5 (GLOVE) ×1 IMPLANT
GLOVE BIOGEL PI IND STRL 7.0 (GLOVE) ×1 IMPLANT
GLOVE BIOGEL PI IND STRL 8 (GLOVE) ×1 IMPLANT
GLOVE BIOGEL PI INDICATOR 6.5 (GLOVE) ×2
GLOVE BIOGEL PI INDICATOR 7.0 (GLOVE) ×2
GLOVE BIOGEL PI INDICATOR 8 (GLOVE) ×2
GLOVE SURG SS PI 6.5 STRL IVOR (GLOVE) ×3 IMPLANT
GOWN STRL REUS W/ TWL LRG LVL3 (GOWN DISPOSABLE) ×3 IMPLANT
GOWN STRL REUS W/TWL LRG LVL3 (GOWN DISPOSABLE) ×6
KIT BASIN OR (CUSTOM PROCEDURE TRAY) ×3 IMPLANT
KIT ROOM TURNOVER OR (KITS) ×3 IMPLANT
NS IRRIG 1000ML POUR BTL (IV SOLUTION) ×3 IMPLANT
PACK GENERAL/GYN (CUSTOM PROCEDURE TRAY) ×3 IMPLANT
PAD ARMBOARD 7.5X6 YLW CONV (MISCELLANEOUS) ×6 IMPLANT
SPONGE GAUZE 4X4 12PLY STER LF (GAUZE/BANDAGES/DRESSINGS) ×3 IMPLANT
STAPLER VISISTAT (STAPLE) ×3 IMPLANT
STOCKINETTE IMPERVIOUS LG (DRAPES) ×3 IMPLANT
SUT ETHILON 3 0 PS 1 (SUTURE) IMPLANT
SUT PROLENE 5 0 C 1 36 (SUTURE) ×3 IMPLANT
SUT SILK 0 TIES 10X30 (SUTURE) ×3 IMPLANT
SUT SILK 2 0 (SUTURE) ×2
SUT SILK 2 0 SH CR/8 (SUTURE) ×3 IMPLANT
SUT SILK 2-0 18XBRD TIE 12 (SUTURE) ×1 IMPLANT
SUT SILK 3 0 (SUTURE) ×2
SUT SILK 3-0 18XBRD TIE 12 (SUTURE) ×1 IMPLANT
SUT VIC AB 2-0 CT1 18 (SUTURE) ×3 IMPLANT
UNDERPAD 30X30 INCONTINENT (UNDERPADS AND DIAPERS) ×3 IMPLANT
WATER STERILE IRR 1000ML POUR (IV SOLUTION) ×3 IMPLANT

## 2015-06-09 NOTE — Progress Notes (Signed)
Lab called cbc clotted new order placed for another CBC. Lab called and informed of need to redraw

## 2015-06-09 NOTE — Anesthesia Procedure Notes (Signed)
Procedure Name: LMA Insertion Date/Time: 06/09/2015 1:50 PM Performed by: Garrison Columbus T Pre-anesthesia Checklist: Patient identified, Emergency Drugs available, Suction available and Patient being monitored Patient Re-evaluated:Patient Re-evaluated prior to inductionOxygen Delivery Method: Circle system utilized Preoxygenation: Pre-oxygenation with 100% oxygen Intubation Type: IV induction LMA: LMA inserted LMA Size: 5.0 Number of attempts: 1 Airway Equipment and Method: Stylet Placement Confirmation: positive ETCO2 and breath sounds checked- equal and bilateral Tube secured with: Tape Dental Injury: Teeth and Oropharynx as per pre-operative assessment

## 2015-06-09 NOTE — Anesthesia Postprocedure Evaluation (Signed)
Anesthesia Post Note  Patient: William Walters  Procedure(s) Performed: Procedure(s) (LRB): AMPUTATION ABOVE KNEE (Right)  Patient location during evaluation: PACU Anesthesia Type: General Level of consciousness: awake and alert Pain management: pain level controlled Vital Signs Assessment: post-procedure vital signs reviewed and stable Respiratory status: spontaneous breathing, nonlabored ventilation and respiratory function stable Cardiovascular status: blood pressure returned to baseline and stable Postop Assessment: no signs of nausea or vomiting Anesthetic complications: no    Last Vitals:  Filed Vitals:   06/09/15 1050 06/09/15 1505  BP: 148/110 155/79  Pulse:    Temp:  36.4 C  Resp:      Last Pain:  Filed Vitals:   06/09/15 1509  PainSc: 0-No pain                 Jeriah Corkum,W. EDMOND

## 2015-06-09 NOTE — Progress Notes (Signed)
Pt has been admitted to 2W. Pt's right AKA dressing has been changed. Pt's vital signs are stable. He is resting in the bed. Will continue to monitor.   Grant Fontana RN, BSN

## 2015-06-09 NOTE — Anesthesia Preprocedure Evaluation (Addendum)
Anesthesia Evaluation  Patient identified by MRN, date of birth, ID band Patient awake    Reviewed: Allergy & Precautions, H&P , NPO status , Patient's Chart, lab work & pertinent test results  Airway Mallampati: II  TM Distance: >3 FB Neck ROM: Full    Dental no notable dental hx. (+) Edentulous Upper, Edentulous Lower, Dental Advisory Given   Pulmonary neg pulmonary ROS, former smoker,    Pulmonary exam normal breath sounds clear to auscultation       Cardiovascular hypertension, Pt. on medications + Peripheral Vascular Disease   Rhythm:Regular Rate:Normal     Neuro/Psych  Headaches, Alzheimer's Dz CVA, Residual Symptoms negative psych ROS   GI/Hepatic negative GI ROS, Neg liver ROS,   Endo/Other  negative endocrine ROS  Renal/GU negative Renal ROS  negative genitourinary   Musculoskeletal   Abdominal   Peds  Hematology negative hematology ROS (+)   Anesthesia Other Findings   Reproductive/Obstetrics negative OB ROS                            Anesthesia Physical Anesthesia Plan  ASA: III  Anesthesia Plan: General   Post-op Pain Management:    Induction: Intravenous  Airway Management Planned: LMA and Oral ETT  Additional Equipment:   Intra-op Plan:   Post-operative Plan: Extubation in OR  Informed Consent: I have reviewed the patients History and Physical, chart, labs and discussed the procedure including the risks, benefits and alternatives for the proposed anesthesia with the patient or authorized representative who has indicated his/her understanding and acceptance.   Dental advisory given  Plan Discussed with: CRNA  Anesthesia Plan Comments:         Anesthesia Quick Evaluation

## 2015-06-09 NOTE — Op Note (Signed)
    NAME: William Walters   MRN: [id number] DOB: 06-29-1932    DATE OF OPERATION: 06/09/2015  PREOP DIAGNOSIS: ischemic right foot  POSTOP DIAGNOSIS: same  PROCEDURE: Right above-the-knee amputation  SURGEON: Judeth Cornfield. Scot Dock, MD, FACS  ASSIST: Leontine Locket, PA  ANESTHESIA: Gen.   EBL: minimal  INDICATIONS: William Walters is a 82 y.o. male he presented with gangrene of the right foot and was not a candidate for further revascularization. He presents for primary above-the-knee amputation as he has a contracture in the right lower extremity.  FINDINGS: The muscle was well perfused.  TECHNIQUE: The patient was taken to the operating room and received a general anesthetic. The right lower extremity was prepped and draped in usual sterile fashion. A fishmouth incision was marked above the level of the patella. The leg was exsanguinated with an Esmarch bandage and the tourniquet which had been placed on the upper thigh was inflated to 300 mmHg. Under tourniquet control, the incision was carried down to the skin, subcutaneous tissue, fascia, and muscle to the femur which was dissected free circumferentially. The bone was divided proximal to the level of skin division. The femoral artery and vein were individually suture ligated. There was a bypass graft which also was ligated. The tourniquet was then released. Additional hemostasis was obtained using electrocautery and 2-0 silk ties. The edges of the bone were rasped. The wound was irrigated with copious amounts of saline. The fascial layer was then closed with interrupted 2-0 Vicryl's. The skin was closed with staples. A sterile dressing was applied. The patient tolerated well and was transferred to the recovery room in stable condition. All needle and sponge counts were correct.  [name], MD, FACS [facility]  DATE OF DICTATION:   06/09/2015

## 2015-06-09 NOTE — H&P (View-Only) (Signed)
[facility]   Patient name: William Walters MRN: [id number] DOB: 04/13/1933 Sex: male  REASON FOR CONSULT: Ischemic right foot. Referred by Dr. Bradd Burner.  HPI: William Walters is a 82 y.o. male, who is nonambulatory. He has presented with progressive ischemia of the right lower extremity. Of note he lives at home with his daughter that stays at the [facility] daycare center during the day. He has been nonambulatory and is in a wheelchair. He has a history of significant dementia. He does describe pain in his right foot which is fairly constant. I cannot obtain any reliable history from the patient.  I have reviewed the records that were sent with the patient. He does have a history of vascular dementia with behavioral disturbances including delusions and insomnia. He has a history of lung cancer and is status post radiation therapy. He also has moderate protein calorie malnutrition with anorexia and weight loss.  Past Medical History  Diagnosis Date  . Hypertension   . Stroke ([place])   . Alzheimer disease   . Cancer [facility])     Lung    History reviewed. No pertinent family history.  SOCIAL HISTORY: Social History   Social History  . Marital Status: Single    Spouse Name: N/A  . Number of Children: N/A  . Years of Education: N/A   Occupational History  . Not on file.   Social History Main Topics  . Smoking status: Former Research scientist (life sciences)  . Smokeless tobacco: Never Used  . Alcohol Use: No  . Drug Use: No  . Sexual Activity: Not on file   Other Topics Concern  . Not on file   Social History Narrative    No Known Allergies  Current Outpatient Prescriptions  Medication Sig Dispense Refill  . Cholecalciferol (VITAMIN D-3 PO) Take 50,000 Units by mouth every 30 (thirty) days.    Marland Kitchen donepezil (ARICEPT) 10 MG tablet Take 10 mg by mouth at bedtime.    . hydrochlorothiazide (HYDRODIURIL) 25 MG tablet Take 25 mg by mouth daily.    Marland Kitchen lisinopril (PRINIVIL,ZESTRIL) 20 MG  tablet Take 1 tablet (20 mg total) by mouth daily.    . polyethylene glycol powder (MIRALAX) powder Take 1 Container by mouth once.    . traZODone (DESYREL) 50 MG tablet Take 50 mg by mouth at bedtime.     No current facility-administered medications for this visit.    REVIEW OF SYSTEMS: The patient was unable to complete his review of systems. '[X]'$  denotes positive finding, '[ ]'$  denotes negative finding Cardiac  Comments:  Chest pain or chest pressure:    Shortness of breath upon exertion:    Short of breath when lying flat:    Irregular heart rhythm:        Vascular    Pain in calf, thigh, or hip brought on by ambulation:    Pain in feet at night that wakes you up from your sleep:     Blood clot in your veins:    Leg swelling:         Pulmonary    Oxygen at home:    Productive cough:     Wheezing:         Neurologic    Sudden weakness in arms or legs:     Sudden numbness in arms or legs:     Sudden onset of difficulty speaking or slurred speech:    Temporary loss of vision in one eye:     Problems with  dizziness:         Gastrointestinal    Blood in stool:     Vomited blood:         Genitourinary    Burning when urinating:     Blood in urine:        Psychiatric    Major depression:         Hematologic    Bleeding problems:    Problems with blood clotting too easily:        Skin    Rashes or ulcers:        Constitutional    Fever or chills:      PHYSICAL EXAM: Filed Vitals:   06/01/15 1038  BP: 132/78  Pulse: 76  Temp: 98.1 F (36.7 C)  TempSrc: Oral  Resp: 16  Height: '6\' 2"'$  (1.88 m)  Weight: 167 lb (75.751 kg)  SpO2: 100%    GENERAL: The patient is a well-nourished male, in no acute distress. The vital signs are documented above. CARDIAC: There is a regular rate and rhythm.  VASCULAR: I do not detect carotid bruits. He has palpable femoral pulses. I cannot palpate popliteal or pedal pulses. PULMONARY: There is good air exchange bilaterally  without wheezing or rales. ABDOMEN: Soft and non-tender with normal pitched bowel sounds.  MUSCULOSKELETAL: There are no major deformities or cyanosis. He has a contracture of his right knee. NEUROLOGIC: No focal weakness or paresthesias are detected. SKIN: He has dry gangrene of the right third toe on the dorsum of the toe and a smaller wound on the adjacent fourth toe. PSYCHIATRIC: The patient has a normal affect.  DATA:   LOWER EXTREMITY ARTERIAL DOPPLER STUDY: I have independently interpreted his lower extremity arterial Doppler study. On the right side there is a monophasic anterior tibial signal only. ABI is 49%. On the left side, there is a monophasic posterior tibial signal and dorsalis pedis signal. ABI is 63%.  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 He has nonhealing wounds of the right foot. Given his dementia, non-ambulatory status, severe protein malnutrition, and other medical comorbidities, I do not think he is a candidate for revascularization. I have recommended a right above-the-knee amputation if the family is agreeable. They are not present today. Given the contracture of the knee as he no advantage to doing it below the knee especially given that he is nonambulatory. We can arrange for a right above-the-knee amputation if the family is agreeable.   [name] [facility] [place]: [phone]

## 2015-06-09 NOTE — Interval H&P Note (Signed)
History and Physical Interval Note:  06/09/2015 1:10 PM  William Walters  has presented today for surgery, with the diagnosis of nonviable tissue; peripheral vascular disease  The various methods of treatment have been discussed with the patient and family. After consideration of risks, benefits and other options for treatment, the patient has consented to  Procedure(s): AMPUTATION ABOVE KNEE (Right) as a surgical intervention .  The patient's history has been reviewed, patient examined, no change in status, stable for surgery.  I have reviewed the patient's chart and labs.  Questions were answered to the patient's satisfaction.     [name]

## 2015-06-09 NOTE — Transfer of Care (Signed)
Immediate Anesthesia Transfer of Care Note  Patient: William Walters  Procedure(s) Performed: Procedure(s): AMPUTATION ABOVE KNEE (Right)  Patient Location: PACU  Anesthesia Type:General  Level of Consciousness: awake  Airway & Oxygen Therapy: Patient Spontanous Breathing and Patient connected to face mask oxygen  Post-op Assessment: Report given to RN, Post -op Vital signs reviewed and stable and Patient moving all extremities X 4  Post vital signs: Reviewed and stable  Last Vitals:  Filed Vitals:   06/09/15 0945 06/09/15 1050  BP:  148/110  Pulse: 70   Temp: 36.3 C   Resp: 16     Complications: No apparent anesthesia complications

## 2015-06-10 ENCOUNTER — Encounter (HOSPITAL_COMMUNITY): Payer: Self-pay | Admitting: Vascular Surgery

## 2015-06-10 ENCOUNTER — Telehealth: Payer: Self-pay | Admitting: Vascular Surgery

## 2015-06-10 LAB — BASIC METABOLIC PANEL
Anion gap: 10 (ref 5–15)
BUN: 11 mg/dL (ref 6–20)
CHLORIDE: 104 mmol/L (ref 101–111)
CO2: 24 mmol/L (ref 22–32)
CREATININE: 0.84 mg/dL (ref 0.61–1.24)
Calcium: 8.7 mg/dL — ABNORMAL LOW (ref 8.9–10.3)
GFR calc Af Amer: >60 mL/min (ref >60)
GFR calc non Af Amer: >60 mL/min (ref >60)
GLUCOSE: 122 mg/dL — AB (ref 65–99)
POTASSIUM: 4.5 mmol/L (ref 3.5–5.1)
SODIUM: 138 mmol/L (ref 135–145)

## 2015-06-10 LAB — CBC
HEMATOCRIT: 36.1 % — AB (ref 39.0–52.0)
Hemoglobin: 11.6 g/dL — ABNORMAL LOW (ref 13.0–17.0)
MCH: 29.4 pg (ref 26.0–34.0)
MCHC: 32.1 g/dL (ref 30.0–36.0)
MCV: 91.4 fL (ref 78.0–100.0)
PLATELETS: 170 10*3/uL (ref 150–400)
RBC: 3.95 MIL/uL — ABNORMAL LOW (ref 4.22–5.81)
RDW: 14.5 % (ref 11.5–15.5)
WBC: 9.9 10*3/uL (ref 4.0–10.5)

## 2015-06-10 NOTE — Telephone Encounter (Signed)
-----   Message from Denman George, RN sent at 06/09/2015  3:37 PM EST ----- Regarding: [name] log; also needs 4 week f/u with [facility]   ----- Message -----    From: Gabriel Earing, PA-C    Sent: 06/09/2015   2:54 PM      To: Vvs Charge Pool  S/p right AKA 06/09/15.  F/u with [facility] in 4 weeks.  Thanks, Aldona Bar

## 2015-06-10 NOTE — Evaluation (Signed)
Occupational Therapy Evaluation Patient Details Name: William Walters MRN: [id number] DOB: 16-Jul-1932 Today's Date: 06/10/2015    History of Present Illness S/P R AKA PMH: PVD, HTN, CVA, dementia.   Clinical Impression   Pt was able to transfer and propel his manual w/c with his feet prior to admission.  He was assisted for bathing and dressing and could self feed and toilet without assistance.  Pt presents with baseline R UE hemiplegia and cognitive impairment, poor sitting balance and dependence in self care.  Spoke to daughter by phone who anticipated need for ST rehab prior to return home.  Will defer further OT to SNF.    Follow Up Recommendations  SNF;Supervision/Assistance - 24 hour    Equipment Recommendations       Recommendations for Other Services       Precautions / Restrictions Precautions Precautions: Fall Precaution Comments: R AKA Restrictions Weight Bearing Restrictions: No RLE Weight Bearing: Non weight bearing      Mobility Bed Mobility Overal bed mobility: +2 for physical assistance;Needs Assistance Bed Mobility: Supine to Sit     Supine to sit: HOB elevated;+2 for physical assistance;Mod assist     General bed mobility comments: assist for all aspects  Transfers                 General transfer comment: deferred this date    Balance Overall balance assessment: Needs assistance   Sitting balance-Leahy Scale: Poor   Postural control: Posterior lean;Right lateral lean                                  ADL Overall ADL's : Needs assistance/impaired Eating/Feeding: Set up;Bed level;Sitting   Grooming: Minimal assistance;Bed level                                 General ADL Comments: Pt requiring total assist for bathing and dressing.     Vision     Perception     Praxis      Pertinent Vitals/Pain Pain Assessment: Faces Faces Pain Scale: Hurts little more Pain Location: R LE Pain Descriptors /  Indicators: Guarding Pain Intervention(s): Repositioned     Hand Dominance     Extremity/Trunk Assessment Upper Extremity Assessment Upper Extremity Assessment: RUE deficits/detail RUE Deficits / Details: increased flexor tone, no functional use from previous CVA RUE Coordination: decreased gross motor;decreased fine motor   Lower Extremity Assessment Lower Extremity Assessment: Defer to PT evaluation   Cervical / Trunk Assessment Cervical / Trunk Assessment: Kyphotic   Communication Communication Communication: Expressive difficulties   Cognition Arousal/Alertness: Awake/alert Behavior During Therapy: WFL for tasks assessed/performed Overall Cognitive Status: History of cognitive impairments - at baseline       Memory: Decreased short-term memory             General Comments       Exercises       Shoulder Instructions      Home Living Family/patient expects to be discharged to:: Skilled nursing facility                                        Prior Functioning/Environment Level of Independence: Needs assistance  Gait / Transfers Assistance Needed: non ambulatory, propeled manual chair with feet independently, transferred  independently ADL's / Homemaking Assistance Needed: Daughter assisted with bathing and dressing, pt self fed and toileted in standing without assist. Communication / Swallowing Assistance Needed: slurred speech from previous CVA Comments: Pt goes to [facility] for daycare.    OT Diagnosis: Generalized weakness;Cognitive deficits;Acute pain;Hemiplegia dominant side   OT Problem List: Decreased strength;Decreased range of motion;Decreased activity tolerance;Impaired balance (sitting and/or standing);Decreased coordination;Decreased cognition;Decreased safety awareness;Decreased knowledge of use of DME or AE;Impaired UE functional use;Pain;Impaired tone   OT Treatment/Interventions:      OT Goals(Current goals can be found in the  care plan section) Acute Rehab OT Goals Patient Stated Goal: Daughter wants pt to have rehab prior to return home.  OT Frequency:     Barriers to D/C:            Co-evaluation PT/OT/SLP Co-Evaluation/Treatment: Yes Reason for Co-Treatment: For patient/therapist safety   OT goals addressed during session: ADL's and self-care      End of Session    Activity Tolerance: Patient tolerated treatment well Patient left: in bed;with call bell/phone within reach;with bed alarm set   Time: 2575-0518 OT Time Calculation (min): 18 min Charges:  OT General Charges $OT Visit: 1 Procedure OT Evaluation $OT Eval Moderate Complexity: 1 Procedure G-Codes:    Malka So 06/10/2015, 10:00 AM  [phone]

## 2015-06-10 NOTE — Evaluation (Addendum)
Physical Therapy Evaluation Patient Details Name: William Walters MRN: [id number] DOB: 1932-07-27 Today's Date: 06/10/2015   History of Present Illness  S/P R AKA PMH: PVD, HTN, CVA, dementia.  Clinical Impression  Patient presents with functional limitations due to deficits listed in PT problem list (see below). Pt with hx of dementia, increased tone RUE from prior CVA now presents with post surgical pain, weakness and lethargy all impacting mobility. Requires assist sitting EOB due to decreased balance. Deferred OOB to chair today due to lethargy and safety concerns. PTA, pt independently transferring to w/c. Would benefit from ST SNF to maximize independence and mobility prior to return home with support from daughter. Daughter agreeable to [facility]. Will follow acutely.    Follow Up Recommendations SNF    Equipment Recommendations  None recommended by PT    Recommendations for Other Services       Precautions / Restrictions Precautions Precautions: Fall Precaution Comments: R AKA Restrictions Weight Bearing Restrictions: No RLE Weight Bearing: Non weight bearing      Mobility  Bed Mobility Overal bed mobility: +2 for physical assistance;Needs Assistance Bed Mobility: Supine to Sit;Sit to Supine     Supine to sit: HOB elevated;+2 for physical assistance;Mod assist Sit to supine: Max assist;+2 for physical assistance   General bed mobility comments: Assist with scooting bottom, LEs and elevating trunk. Pt sleepy. Reports some dizziness.  Transfers                 General transfer comment: Deferred. Pt declined and lethargic (just woke up from sleeping).  Ambulation/Gait                Stairs            Wheelchair Mobility    Modified Rankin (Stroke Patients Only)       Balance Overall balance assessment: Needs assistance Sitting-balance support: Feet supported;Single extremity supported Sitting balance-Leahy Scale: Poor Sitting balance -  Comments: Pt pushing at times- leaning towards right and posteriorly. Requires assist for balance at all times with instances of Min guard assist for a few seconds.  Postural control: Posterior lean;Right lateral lean                                   Pertinent Vitals/Pain Pain Assessment: Faces Faces Pain Scale: Hurts little more Pain Location: R residual limb Pain Descriptors / Indicators: Guarding Pain Intervention(s): Repositioned    Home Living Family/patient expects to be discharged to:: Skilled nursing facility                      Prior Function Level of Independence: Needs assistance   Gait / Transfers Assistance Needed: non ambulatory, propeled manual chair with feet independently, transferred independently  ADL's / Homemaking Assistance Needed: Daughter assisted with bathing and dressing, pt self fed and toileted in standing without assist.  Comments: [facility].     Hand Dominance        Extremity/Trunk Assessment   Upper Extremity Assessment: Defer to OT evaluation RUE Deficits / Details: increased flexor tone, no functional use from previous CVA         Lower Extremity Assessment: RLE deficits/detail;Generalized weakness RLE Deficits / Details: Positioned in hip flexion; not able to relax into neutral despite manual or verbal cues.     Cervical / Trunk Assessment: Kyphotic  Communication   Communication: Expressive difficulties  Cognition Arousal/Alertness: Lethargic Behavior During Therapy: WFL for tasks assessed/performed Overall Cognitive Status: History of cognitive impairments - at baseline       Memory: Decreased short-term memory              General Comments      Exercises General Exercises - Lower Extremity Long Arc Quad: Right;5 reps;Seated      Assessment/Plan    PT Assessment Patient needs continued PT services  PT Diagnosis Acute pain;Generalized weakness;Altered mental status    PT Problem List Decreased strength;Pain;Impaired tone;Decreased range of motion;Decreased mobility;Decreased activity tolerance;Decreased balance;Decreased safety awareness;Decreased cognition  PT Treatment Interventions Functional mobility training;Therapeutic activities;Therapeutic exercise;Patient/family education;Balance training;Wheelchair mobility training;Neuromuscular re-education   PT Goals (Current goals can be found in the Care Plan section) Acute Rehab PT Goals Patient Stated Goal: Daughter wants pt to have rehab prior to return home. PT Goal Formulation: With family Time For Goal Achievement: 06/24/15 Potential to Achieve Goals: Good    Frequency Min 2X/week   Barriers to discharge Decreased caregiver support pt's daughter works.    Co-evaluation PT/OT/SLP Co-Evaluation/Treatment: Yes Reason for Co-Treatment: For patient/therapist safety PT goals addressed during session: Mobility/safety with mobility;Strengthening/ROM OT goals addressed during session: ADL's and self-care       End of Session   Activity Tolerance: Patient limited by lethargy Patient left: in bed;with call bell/phone within reach;with bed alarm set;with SCD's reapplied Nurse Communication: Mobility status;Need for lift equipment         Time: [phone] PT Time Calculation (min) (ACUTE ONLY): 21 min   Charges:   PT Evaluation $PT Eval Moderate Complexity: 1 Procedure     PT G Codes:        Markee Matera A Marycruz Boehner 06/10/2015, 11:17 AM  Wray Kearns, PT, DPT [phone]

## 2015-06-10 NOTE — Progress Notes (Signed)
Utilization review completed.  

## 2015-06-10 NOTE — Telephone Encounter (Signed)
No answer, no vm, dpm

## 2015-06-10 NOTE — Progress Notes (Addendum)
[facility] of   Subjective  - Resting comfortably.  Nursing staff had to re warp the ace several times last night because the patient kept taking if off.    Objective 146/74 68 98.2 F (36.8 C) (Oral) 18 100%  Intake/Output Summary (Last 24 hours) at 06/10/15 0805 Last data filed at 06/09/15 1945  Gross per 24 hour  Intake   1120 ml  Output    100 ml  Net   1020 ml    Dressing clean and dry this am.  Assessment/Planning: POD # 1 AKA He was living at home with his daughter and going to [facility] day care.  Discharge planning pending I will consult social work for assistance.  He has significant dementia.  Laurence Slate [facility] 06/10/2015 8:05 AM --  Laboratory Lab Results:  Recent Labs  06/09/15 1828 06/10/15 0240  WBC 10.2 9.9  HGB 11.7* 11.6*  HCT 36.5* 36.1*  PLT 162 170   BMET  Recent Labs  06/09/15 1140 06/09/15 1828 06/10/15 0240  NA 141  --  138  K 4.4  --  4.5  CL 106  --  104  CO2 25  --  24  GLUCOSE 104*  --  122*  BUN 14  --  11  CREATININE 1.03 1.03 0.84  CALCIUM 9.3  --  8.7*   Agree with above.  Dressing dry.  [name], MD, [place] [phone] Office: [phone]

## 2015-06-11 LAB — CBC
HCT: 34.3 % — ABNORMAL LOW (ref 39.0–52.0)
Hemoglobin: 11.4 g/dL — ABNORMAL LOW (ref 13.0–17.0)
MCH: 30.6 pg (ref 26.0–34.0)
MCHC: 33.2 g/dL (ref 30.0–36.0)
MCV: 92 fL (ref 78.0–100.0)
PLATELETS: 156 10*3/uL (ref 150–400)
RBC: 3.73 MIL/uL — AB (ref 4.22–5.81)
RDW: 14.4 % (ref 11.5–15.5)
WBC: 10.4 10*3/uL (ref 4.0–10.5)

## 2015-06-11 LAB — BASIC METABOLIC PANEL
Anion gap: 10 (ref 5–15)
BUN: 10 mg/dL (ref 6–20)
CO2: 24 mmol/L (ref 22–32)
CREATININE: 0.79 mg/dL (ref 0.61–1.24)
Calcium: 8.5 mg/dL — ABNORMAL LOW (ref 8.9–10.3)
Chloride: 103 mmol/L (ref 101–111)
Glucose, Bld: 98 mg/dL (ref 65–99)
Potassium: 4 mmol/L (ref 3.5–5.1)
SODIUM: 137 mmol/L (ref 135–145)

## 2015-06-11 NOTE — Progress Notes (Addendum)
[facility] Special[facility] of [facility]  VASCULAR SURGERY ASSESSMENT AND PLAN:  Agree with noted below. He stays with his daughter at night and goes to [employer] during the day. Anticipate discharge Monday.  [name], MD, FACS Beeper [phone]  Subjective  - Comfortable and pleasant.  Objective 154/76 75 99.1 F (37.3 C) (Oral) 16 97%  Intake/Output Summary (Last 24 hours) at 06/11/15 0747 Last data filed at 06/11/15 6767  Gross per 24 hour  Intake    840 ml  Output   1100 ml  Net   -260 ml    Right AKA incision clean and dry Clean dry dressing applied  Assessment/Planning: POD # 2 AKA Stump is viable  Discharge planning I have consulted social work for assistance.  Laurence Slate [facility] 06/11/2015 7:47 AM --  Laboratory Lab Results:  Recent Labs  06/10/15 0240 06/11/15 0659  WBC 9.9 10.4  HGB 11.6* 11.4*  HCT 36.1* 34.3*  PLT 170 156   BMET  Recent Labs  06/09/15 1140 06/09/15 1828 06/10/15 0240  NA 141  --  138  K 4.4  --  4.5  CL 106  --  104  CO2 25  --  24  GLUCOSE 104*  --  122*  BUN 14  --  11  CREATININE 1.03 1.03 0.84  CALCIUM 9.3  --  8.7*    COAG Lab Results  Component Value Date   INR 2.09* 09/20/2012   INR 0.93 08/28/2012   INR 1.00 07/31/2012   No results found for: PTT

## 2015-06-11 NOTE — Progress Notes (Signed)
Pt. Refused lab work this morning. Rescheduled it for 7:00 am.

## 2015-06-12 NOTE — Progress Notes (Addendum)
[facility]ists of [facility]  VASCULAR SURGERY ASSESSMENT AND PLAN:  Agree with note below. He will need to go to a skilled nursing facility for some physical therapy before going home with his daughter. Once he is home he goes to [facility] during [facility] day. He is ready for transfer to a skilled nursing facility once a bed is available.  [name], MD, FACS Beeper [phone] Office: [phone]  Subjective  - Pleasant and comfortable.  Objective 132/60 74 99.2 F (37.3 C) (Oral) 18 97%  Intake/Output Summary (Last 24 hours) at 06/12/15 0732 Last data filed at 06/12/15 6701  Gross per 24 hour  Intake    600 ml  Output   1625 ml  Net  -1025 ml   Right  AKA incision healing well New dressing applied  Assessment/Planning: POD # 3 AKA  Stump is viable Plan discharge tomorrow he lives with his daughter  Roxy Horseman 06/12/2015 7:32 AM --  Laboratory Lab Results:  Recent Labs  06/10/15 0240 06/11/15 0659  WBC 9.9 10.4  HGB 11.6* 11.4*  HCT 36.1* 34.3*  PLT 170 156   BMET  Recent Labs  06/10/15 0240 06/11/15 0659  NA 138 137  K 4.5 4.0  CL 104 103  CO2 24 24  GLUCOSE 122* 98  BUN 11 10  CREATININE 0.84 0.79  CALCIUM 8.7* 8.5*    COAG Lab Results  Component Value Date   INR 2.09* 09/20/2012   INR 0.93 08/28/2012   INR 1.00 07/31/2012   No results found for: PTT

## 2015-06-12 NOTE — Progress Notes (Signed)
   06/12/15 0245  Vitals  BP 135/62 mmHg  BP Location Left Arm  BP Method Automatic  Patient Position (if appropriate) Lying  Pulse Rate 81  Pulse Rate Source Dinamap  Oxygen Therapy  SpO2 96 %  O2 Device Nasal Cannula  O2 Flow Rate (L/min) 2 L/min  Pain Assessment  Pain Score Asleep    Patient had 12 beats of Vtach this morning. Pt asleep. Respiration is regular and unlabored. No acute changes from his baseline. Will continue to monitor closely.

## 2015-06-13 MED ORDER — OXYCODONE HCL 5 MG PO TABS: 5.0000 mg | ORAL_TABLET | ORAL | Status: DC | PRN

## 2015-06-13 NOTE — NC FL2 (Signed)
[place] LEVEL OF CARE SCREENING TOOL     IDENTIFICATION  Patient Name: William Walters Birthdate: 23-Jan-1933 Sex: male Admission Date (Current Location): 06/09/2015  [facility] and [place] Number:  Herbalist and Address:  [facility] [place], [place] [address], [place], [place] [address]      Provider Number: [id number]  Attending Physician Name and Address:  Angelia Mould, MD  Relative Name and Phone Number:       Current Level of Care: Hospital Recommended Level of Care: [place] Prior Approval Number:    Date Approved/Denied:   PASRR Number: [id number]  Discharge Plan: SNF    Current Diagnoses: Patient Active Problem List   Diagnosis Date Noted  . 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 Late effects of CVA (cerebrovascular accident) 04/20/2013  . Essential hypertension, benign 04/07/2013  . 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 Fall at home 09/21/2012  . Pulmonary embolism ([place]) 09/21/2012  . Malignant neoplasm of upper lobe, bronchus or lung 08/29/2012  . Vascular dementia, uncomplicated     Orientation RESPIRATION BLADDER Height & Weight      (dementia)  Normal Incontinent Weight: 133 lb 9.6 oz (60.6 kg) Height:  '6\' 2"'$  (188 cm)  BEHAVIORAL SYMPTOMS/MOOD NEUROLOGICAL BOWEL NUTRITION STATUS      Incontinent Diet  AMBULATORY STATUS COMMUNICATION OF NEEDS Skin   Extensive Assist Verbally Surgical wounds                       Personal Care Assistance Level of Assistance  Bathing, Feeding, Dressing Bathing Assistance: Maximum assistance Feeding assistance: Limited assistance Dressing Assistance: Maximum assistance     Functional Limitations Info             SPECIAL CARE FACTORS FREQUENCY  PT (By licensed PT), OT (By licensed OT)     PT Frequency: 5/wk OT Frequency: 5/wk            Contractures      Additional Factors Info  Code Status, Allergies, Psychotropic    Allergies Info: NKA Psychotropic Info: trazodone, aricept         Current Medications (06/13/2015):  This is the current hospital active medication list Current Facility-Administered Medications  Medication Dose Route Frequency Provider Last Rate Last Dose  . 0.9 %  sodium chloride infusion   Intravenous Continuous Gabriel Earing, PA-C 75 mL/hr at 06/13/15 0033    . acetaminophen (TYLENOL) tablet 325-650 mg  325-650 mg Oral Q4H PRN Hulen Shouts Rhyne, PA-C   325 mg at 06/11/15 0130   Or  . acetaminophen (TYLENOL) suppository 325-650 mg  325-650 mg Rectal Q4H PRN Hulen Shouts Rhyne, PA-C      . alum & mag hydroxide-simeth (MAALOX/MYLANTA) 200-200-20 MG/5ML suspension 15-30 mL  15-30 mL Oral Q2H PRN Samantha J Rhyne, PA-C      . bisacodyl (DULCOLAX) suppository 10 mg  10 mg Rectal Daily PRN Samantha J Rhyne, PA-C      . docusate sodium (COLACE) capsule 100 mg  100 mg Oral Daily Hulen Shouts Rhyne, PA-C   100 mg at 06/12/15 9381  . donepezil (ARICEPT) tablet 10 mg  10 mg Oral QHS Hulen Shouts Rhyne, PA-C   10 mg at 06/12/15 2209  . enoxaparin (LOVENOX) injection 30 mg  30 mg Subcutaneous Q24H Samantha J Rhyne, PA-C   30 mg at 06/12/15 1544  . feeding supplement (ENSURE ENLIVE) (ENSURE ENLIVE) liquid 237 mL  237 mL  Oral BID BM Angelia Mould, MD   237 mL at 06/12/15 1400  . guaiFENesin-dextromethorphan (ROBITUSSIN DM) 100-10 MG/5ML syrup 15 mL  15 mL Oral Q4H PRN Samantha J Rhyne, PA-C      . hydrALAZINE (APRESOLINE) injection 5 mg  5 mg Intravenous Q20 Min PRN Samantha J Rhyne, PA-C      . labetalol (NORMODYNE,TRANDATE) injection 10 mg  10 mg Intravenous Q10 min PRN Samantha J Rhyne, PA-C      . lisinopril (PRINIVIL,ZESTRIL) tablet 20 mg  20 mg Oral Daily Samantha J Rhyne, PA-C   20 mg at 06/12/15 1505  . memantine (NAMENDA) tablet 10 mg  10 mg Oral BID Hulen Shouts Rhyne, PA-C   10 mg at 06/12/15 2209  . metoprolol (LOPRESSOR) injection 2-5 mg  2-5 mg Intravenous Q2H PRN Samantha J Rhyne, PA-C       . morphine 2 MG/ML injection 2-3 mg  2-3 mg Intravenous Q2H PRN Samantha J Rhyne, PA-C      . ondansetron (ZOFRAN) injection 4 mg  4 mg Intravenous Q6H PRN Samantha J Rhyne, PA-C      . oxyCODONE (Oxy IR/ROXICODONE) immediate release tablet 5 mg  5 mg Oral Q4H PRN Hulen Shouts Rhyne, PA-C   5 mg at 06/10/15 0258  . pantoprazole (PROTONIX) EC tablet 40 mg  40 mg Oral Daily Hulen Shouts Rhyne, PA-C   40 mg at 06/12/15 6979  . phenol (CHLORASEPTIC) mouth spray 1 spray  1 spray Mouth/Throat PRN Samantha J Rhyne, PA-C      . polyethylene glycol (MIRALAX / GLYCOLAX) packet 17 g  17 g Oral Daily PRN Samantha J Rhyne, PA-C      . potassium chloride SA (K-DUR,KLOR-CON) CR tablet 20-40 mEq  20-40 mEq Oral Daily PRN Samantha J Rhyne, PA-C      . traZODone (DESYREL) tablet 50 mg  50 mg Oral QHS Samantha J Rhyne, PA-C   50 mg at 06/12/15 2209     Discharge Medications: Please see discharge summary for a list of discharge medications.  Relevant Imaging Results:  Relevant Lab Results:   Additional Information SS#: [id number]  Cranford Mon, [place]

## 2015-06-13 NOTE — Progress Notes (Signed)
CSW consulted for SNF placement.  CSW spoke with dtr and [facility] social worker Programme researcher, broadcasting/film/video [phone]) who are both agreeable to SNF placement and prefer placement at [facility].  [facility] is able to accept patient today.  Patient will discharge to [facility] Anticipated discharge date:2/13 Family notified: pt dtr Transportation by [employer]- scheduled for 2pm  CSW signing off.  Domenica Reamer, [place] Social Worker [phone]

## 2015-06-13 NOTE — Discharge Summary (Signed)
[facility] Discharge Summary  William Walters [birth year] 82 y.o. male  [id number]  Admission Date: 06/09/2015  Discharge Date: 06/13/2015  Physician: Angelia Mould, MD  Admission Diagnosis: nonviable tissue; peripheral vascular disease  HPI:   This is a 82 y.o. male who is nonambulatory. He has presented with progressive ischemia of the right lower extremity. Of note he lives at home with his daughter that stays at the [facility] daycare center during the day. He has been nonambulatory and is in a wheelchair. He has a history of significant dementia. He does describe pain in his right foot which is fairly constant. I cannot obtain any reliable history from the patient.  Dr Scot Dock has reviewed the records that were sent with the patient. He does have a history of vascular dementia with behavioral disturbances including delusions and insomnia. He has a history of lung cancer and is status post radiation therapy. He also has moderate protein calorie malnutrition with anorexia and weight loss.  Hospital Course:  The patient was admitted to the hospital and taken to the operating room on 06/09/2015 and underwent: right above-the-knee amputation.     The patient tolerated the procedure well and was transported to the PACU in stable condition.   The patient had an unremarkable post-operative course. His right AKA incision was healing well and viable. CSW was consulted for SNF placement as his daughter felt that he needed some rehab prior to going home. The patient attends [facility] during the day. The patient was discharged to SNF on POD 4 in good condition.     CBC    Component Value Date/Time   WBC 10.4 06/11/2015 0659   RBC 3.73* 06/11/2015 0659   HGB 11.4* 06/11/2015 0659   HCT 34.3* 06/11/2015 0659   PLT 156 06/11/2015 0659   MCV 92.0 06/11/2015 0659   MCH 30.6 06/11/2015 0659   MCHC 33.2 06/11/2015 0659   RDW 14.4 06/11/2015 0659   LYMPHSABS 1.7 02/02/2013 1530     MONOABS 0.8 02/02/2013 1530   EOSABS 2.7* 02/02/2013 1530   BASOSABS 0.1 02/02/2013 1530    BMET    Component Value Date/Time   NA 137 06/11/2015 0659   K 4.0 06/11/2015 0659   CL 103 06/11/2015 0659   CO2 24 06/11/2015 0659   GLUCOSE 98 06/11/2015 0659   BUN 10 06/11/2015 0659   CREATININE 0.79 06/11/2015 0659   CALCIUM 8.5* 06/11/2015 0659   GFRNONAA >60 06/11/2015 0659   GFRAA >60 06/11/2015 0659     Discharge Instructions:   The patient is discharged to SNF with extensive instructions on wound care and progressive ambulation.  They are instructed not to drive or perform any heavy lifting until returning to see the physician in his office.    Discharge Diagnosis:  nonviable tissue; peripheral vascular disease  Secondary Diagnosis: Patient Active Problem List   Diagnosis Date Noted  . 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 Late effects of CVA (cerebrovascular accident) 04/20/2013  . Essential hypertension, benign 04/07/2013  . 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 Fall at home 09/21/2012  . Pulmonary embolism ([place]) 09/21/2012  . Malignant neoplasm of upper lobe, bronchus or lung 08/29/2012  . Vascular dementia, uncomplicated    Past Medical History  Diagnosis Date  . Hypertension   . Stroke ([place])   . Alzheimer disease   . Cancer ([place])     Lung  . Headache        Medication List  ASK your doctor about these medications        donepezil 10 MG tablet  Commonly known as:  ARICEPT  Take 10 mg by mouth at bedtime.     ENSURE PLUS Liqd  Take 237 mLs by mouth 2 (two) times daily between meals.     lisinopril 20 MG tablet  Commonly known as:  PRINIVIL,ZESTRIL  Take 1 tablet (20 mg total) by mouth daily.     memantine 10 MG tablet  Commonly known as:  NAMENDA  Take 10 mg by mouth 2 (two) times daily.     polyethylene glycol packet  Commonly known as:  MIRALAX / GLYCOLAX  Take 17 g by mouth daily as needed for mild constipation.      traZODone 50 MG tablet  Commonly known as:  DESYREL  Take 50 mg by mouth at bedtime.     VITAMIN D-3 PO  Take 50,000 Units by mouth every 30 (thirty) days.        Oxycodone #30 No Refill  Disposition: SNF  Patient's condition: is Good  Follow up: 1. Dr. Scot Dock in 2 weeks   Virgina Jock, PA-C [facility] [phone] 06/13/2015  7:32 AM

## 2015-06-13 NOTE — Progress Notes (Addendum)
  [facility] Progress Note  Subjective  - POD #4   No complaints.   Objective Filed Vitals:   06/12/15 1951 06/13/15 0512  BP: 115/60 148/69  Pulse: 96 98  Temp: 99.7 F (37.6 C) 98.3 F (36.8 C)  Resp: 18 18    Intake/Output Summary (Last 24 hours) at 06/13/15 0729 Last data filed at 06/13/15 4585  Gross per 24 hour  Intake    840 ml  Output   1800 ml  Net   -960 ml    Right AKA incision clean dry and intact. Skin edges viable. Minimal swelling.   Assessment/Planning: 82 y.o. male is s/p: right AKA 4 Days Post-Op   Right AKA healing well.  CSW for SNF placement before going home with daughter. Once he is home, he goes to [facility] day.  Ok to d/c to SNF when bed available.   Alvia Grove 06/13/2015 7:29 AM --  Laboratory CBC    Component Value Date/Time   WBC 10.4 06/11/2015 0659   HGB 11.4* 06/11/2015 0659   HCT 34.3* 06/11/2015 0659   PLT 156 06/11/2015 0659    BMET    Component Value Date/Time   NA 137 06/11/2015 0659   K 4.0 06/11/2015 0659   CL 103 06/11/2015 0659   CO2 24 06/11/2015 0659   GLUCOSE 98 06/11/2015 0659   BUN 10 06/11/2015 0659   CREATININE 0.79 06/11/2015 0659   CALCIUM 8.5* 06/11/2015 0659   GFRNONAA >60 06/11/2015 0659   GFRAA >60 06/11/2015 0659    COAG Lab Results  Component Value Date   INR 2.09* 09/20/2012   INR 0.93 08/28/2012   INR 1.00 07/31/2012   No results found for: PTT  Antibiotics Anti-infectives    Start     Dose/Rate Route Frequency Ordered Stop   06/10/15 0100  cefUROXime (ZINACEF) 1.5 g in dextrose 5 % 50 mL IVPB     1.5 g 100 mL/hr over 30 Minutes Intravenous Every 12 hours 06/09/15 1814 06/10/15 1411   06/08/15 1352  cefUROXime (ZINACEF) 1.5 g in dextrose 5 % 50 mL IVPB     1.5 g 100 mL/hr over 30 Minutes Intravenous 30 min pre-op 06/08/15 1352 06/09/15 1400       Virgina Jock, PA-C [facility] Office: [phone] Pager:  [phone] 06/13/2015 7:29 AM  Agree with note above.  Will need SNF Right AKA looks fine.  [name], MD, [place] [phone] Office: [phone]

## 2015-06-13 NOTE — Clinical Social Work Placement (Signed)
   CLINICAL SOCIAL WORK PLACEMENT  NOTE  Date:  06/13/2015  Patient Details  Name: William Walters MRN: [id number] Date of Birth: 10/08/1932  Clinical Social Work is seeking post-discharge placement for this patient at the [place] level of care (*CSW will initial, date and re-position this form in  chart as items are completed):  Yes   Patient/family provided with [place] Work Department's list of facilities offering this level of care within the geographic area requested by the patient (or if unable, by the patient's family).  Yes   Patient/family informed of their freedom to choose among providers that offer the needed level of care, that participate in Medicare, Medicaid or managed care program needed by the patient, have an available bed and are willing to accept the patient.  Yes   Patient/family informed of [facility]'s ownership interest in [facility] and [facility], as well as of the fact that they are under no obligation to receive care at these facilities.  PASRR submitted to EDS on       PASRR number received on       Existing PASRR number confirmed on 06/13/15     FL2 transmitted to all facilities in geographic area requested by pt/family on 06/13/15     FL2 transmitted to all facilities within larger geographic area on       Patient informed that his/her managed care company has contracts with or will negotiate with certain facilities, including the following:        Yes   Patient/family informed of bed offers received.  Patient chooses bed at [facility]     Physician recommends and patient chooses bed at      Patient to be transferred to [facility] on 06/13/15.  Patient to be transferred to facility by [facility]     Patient family notified on 06/13/15 of transfer.  Name of family member notified:  Ms. Giammarino     PHYSICIAN Please sign FL2     Additional Comment:     _______________________________________________ Cranford Mon, LCSW 06/13/2015, 1:19 PM

## 2015-06-13 NOTE — Care Management Note (Signed)
Case Management Note Marvetta Gibbons RN, BSN Unit 2W-Case Manager [phone]  Patient Details  Name: William Walters MRN: [id number] Date of Birth: 1932/08/02  Subjective/Objective:     Pt admitted s/p AKA               Action/Plan: PTA pt lived at home with family- was part of [facility], plan for pt to d/c to SNF- CSW following for placement needs  Expected Discharge Date:    06/13/15              Expected Discharge Plan:  Skilled Nursing Facility  In-House Referral:  Clinical Social Work  Discharge planning Services  CM Consult  Post Acute Care Choice:    Choice offered to:     DME Arranged:    DME Agency:     HH Arranged:    [place] Agency:     Status of Service:  Completed, signed off  Medicare Important Message Given:    Date Medicare IM Given:    Medicare IM give by:    Date Additional Medicare IM Given:    Additional Medicare Important Message give by:     If discussed at [place] of Stay Meetings, dates discussed:    Discharge Disposition: Skilled Facility   Additional Comments:  Dawayne Patricia, RN 06/13/2015, 1:50 PM

## 2015-06-13 NOTE — Progress Notes (Signed)
Report was called to [employer]. All questions were answered.   Grant Fontana RN, BSN

## 2015-06-13 NOTE — Care Management Important Message (Signed)
Important Message  Patient Details  Name: William Walters MRN: [id number] Date of Birth: 1933/04/24   Medicare Important Message Given:  Yes    [name] P [place] 06/13/2015, 2:06 PM

## 2015-06-13 NOTE — Progress Notes (Signed)
Pt has been discharged. Pt left the floor via stretcher and was accompanied by EMS transporters. Pt's IV and telemetry box were removed. Report was called to [employer] and all questions were answered. Pt was in no distress at the time of discharge.   Grant Fontana RN, BSN

## 2015-06-28 ENCOUNTER — Encounter: Payer: Self-pay | Admitting: Vascular Surgery

## 2015-07-06 ENCOUNTER — Ambulatory Visit (INDEPENDENT_AMBULATORY_CARE_PROVIDER_SITE_OTHER): Payer: Self-pay | Admitting: Vascular Surgery

## 2015-07-06 ENCOUNTER — Encounter: Payer: Self-pay | Admitting: Vascular Surgery

## 2015-07-06 VITALS — BP 137/81 | HR 72 | Temp 97.1°F | Resp 18 | Ht 73.0 in | Wt 130.0 lb

## 2015-07-06 DIAGNOSIS — Z48812 Encounter for surgical aftercare following surgery on the circulatory system: Secondary | ICD-10-CM

## 2015-07-06 NOTE — Progress Notes (Signed)
   Patient name: William Walters MRN: [id number] DOB: 26-Aug-1932 Sex: male  REASON FOR VISIT: Follow up after right above-the-knee amputation.  HPI: William Walters is a 82 y.o. male presented with gangrene of the right foot and was not a candidate for revascularization. He underwent a primary above-the-knee amputation given that he had a contracture of the right lower extremity. This was on 06/09/2015. He returns for his first outpatient visit.  Current Outpatient Prescriptions  Medication Sig Dispense Refill  . Cholecalciferol (VITAMIN D-3 PO) Take 50,000 Units by mouth every 30 (thirty) days.    Marland Kitchen donepezil (ARICEPT) 10 MG tablet Take 10 mg by mouth at bedtime.    . ENSURE PLUS (ENSURE PLUS) LIQD Take 237 mLs by mouth 2 (two) times daily between meals.    Marland Kitchen lisinopril (PRINIVIL,ZESTRIL) 20 MG tablet Take 1 tablet (20 mg total) by mouth daily.    . memantine (NAMENDA) 10 MG tablet Take 10 mg by mouth 2 (two) times daily.    . polyethylene glycol (MIRALAX / GLYCOLAX) packet Take 17 g by mouth daily as needed for mild constipation.    . traZODone (DESYREL) 50 MG tablet Take 50 mg by mouth at bedtime.    Marland Kitchen oxyCODONE (OXY IR/ROXICODONE) 5 MG immediate release tablet Take 1 tablet (5 mg total) by mouth every 4 (four) hours as needed for moderate pain. 30 tablet 0   No current facility-administered medications for this visit.    REVIEW OF SYSTEMS:  '[X]'$  denotes positive finding, '[ ]'$  denotes negative finding Cardiac  Comments:  Chest pain or chest pressure:    Shortness of breath upon exertion:    Short of breath when lying flat:    Irregular heart rhythm:    Constitutional    Fever or chills:      PHYSICAL EXAM: Filed Vitals:   07/06/15 1606  BP: 137/81  Pulse: 72  Temp: 97.1 F (36.2 C)  Resp: 18  Height: '6\' 1"'$  (1.854 m)  Weight: 130 lb (58.968 kg)  SpO2: 98%    GENERAL: The patient is a well-nourished male, in no acute distress. The vital signs are documented  above. CARDIOVASCULAR: There is a regular rate and rhythm. PULMONARY: There is good air exchange bilaterally without wheezing or rales. Right above-the-knee amputation site looks fine without erythema or drainage. He has no wounds on the left foot which is warm and well perfused but no pulses.  MEDICAL ISSUES:  STATUS POST RIGHT ABOVE-THE-KNEE AMPUTATION: His right above-the-knee amputation is healing nicely. We removed his staples in the office today. He has no wounds on the left foot. I've order left ABI in 6 months and see him back at that time. They know to call sooner if he has problems.  [name] [facility]: [phone]

## 2015-08-25 ENCOUNTER — Ambulatory Visit
Admission: RE | Admit: 2015-08-25 | Discharge: 2015-08-25 | Disposition: A | Discharge status: Home / Self Care | Payer: Medicare (Managed Care) | Source: Ambulatory Visit | Attending: Family Medicine | Admitting: Family Medicine

## 2015-08-25 ENCOUNTER — Other Ambulatory Visit: Payer: Self-pay | Admitting: Family Medicine

## 2015-08-25 DIAGNOSIS — M79672 Pain in left foot: Secondary | ICD-10-CM

## 2015-09-15 ENCOUNTER — Encounter: Payer: Self-pay | Admitting: Family

## 2015-09-15 ENCOUNTER — Other Ambulatory Visit: Payer: Self-pay

## 2015-09-15 ENCOUNTER — Ambulatory Visit (INDEPENDENT_AMBULATORY_CARE_PROVIDER_SITE_OTHER): Payer: Medicare (Managed Care) | Admitting: Family

## 2015-09-15 VITALS — BP 96/61 | HR 83 | Temp 96.6°F | Ht 73.0 in | Wt 130.0 lb

## 2015-09-15 DIAGNOSIS — I7025 Atherosclerosis of native arteries of other extremities with ulceration: Secondary | ICD-10-CM | POA: Diagnosis not present

## 2015-09-15 DIAGNOSIS — Z89611 Acquired absence of right leg above knee: Secondary | ICD-10-CM

## 2015-09-15 NOTE — Patient Instructions (Signed)
Peripheral Vascular Disease Peripheral vascular disease (PVD) is a disease of the blood vessels that are not part of your heart and brain. A simple term for PVD is poor circulation. In most cases, PVD narrows the blood vessels that carry blood from your heart to the rest of your body. This can result in a decreased supply of blood to your arms, legs, and internal organs, like your stomach or kidneys. However, it most often affects a person's lower legs and feet. There are two types of PVD.  Organic PVD. This is the more common type. It is caused by damage to the structure of blood vessels.  Functional PVD. This is caused by conditions that make blood vessels contract and tighten (spasm). Without treatment, PVD tends to get worse over time. PVD can also lead to acute ischemic limb. This is when an arm or limb suddenly has trouble getting enough blood. This is a medical emergency. CAUSES Each type of PVD has many different causes. The most common cause of PVD is buildup of a fatty material (plaque) inside of your arteries (atherosclerosis). Small amounts of plaque can break off from the walls of the blood vessels and become lodged in a smaller artery. This blocks blood flow and can cause acute ischemic limb. Other common causes of PVD include:  Blood clots that form inside of blood vessels.  Injuries to blood vessels.  Diseases that cause inflammation of blood vessels or cause blood vessel spasms.  Health behaviors and health history that increase your risk of developing PVD. RISK FACTORS  You may have a greater risk of PVD if you:  Have a family history of PVD.  Have certain medical conditions, including:  High cholesterol.  Diabetes.  High blood pressure (hypertension).  Coronary heart disease.  Past problems with blood clots.  Past injury, such as burns or a broken bone. These may have damaged blood vessels in your limbs.  Buerger disease. This is caused by inflamed blood  vessels in your hands and feet.  Some forms of arthritis.  Rare birth defects that affect the arteries in your legs.  Use tobacco.  Do not get enough exercise.  Are obese.  Are age 50 or older. SIGNS AND SYMPTOMS  PVD may cause many different symptoms. Your symptoms depend on what part of your body is not getting enough blood. Some common signs and symptoms include:  Cramps in your lower legs. This may be a symptom of poor leg circulation (claudication).  Pain and weakness in your legs while you are physically active that goes away when you rest (intermittent claudication).  Leg pain when at rest.  Leg numbness, tingling, or weakness.  Coldness in a leg or foot, especially when compared with the other leg.  Skin or hair changes. These can include:  Hair loss.  Shiny skin.  Pale or bluish skin.  Thick toenails.  Inability to get or maintain an erection (erectile dysfunction). People with PVD are more prone to developing ulcers and sores on their toes, feet, or legs. These may take longer than normal to heal. DIAGNOSIS Your health care provider may diagnose PVD from your signs and symptoms. The health care provider will also do a physical exam. You may have tests to find out what is causing your PVD and determine its severity. Tests may include:  Blood pressure recordings from your arms and legs and measurements of the strength of your pulses (pulse volume recordings).  Imaging studies using sound waves to take pictures of   the blood flow through your blood vessels (Doppler ultrasound).  Injecting a dye into your blood vessels before having imaging studies using:  X-rays (angiogram or arteriogram).  Computer-generated X-rays (CT angiogram).  A powerful electromagnetic field and a computer (magnetic resonance angiogram or MRA). TREATMENT Treatment for PVD depends on the cause of your condition and the severity of your symptoms. It also depends on your age. Underlying  causes need to be treated and controlled. These include long-lasting (chronic) conditions, such as diabetes, high cholesterol, and high blood pressure. You may need to first try making lifestyle changes and taking medicines. Surgery may be needed if these do not work. Lifestyle changes may include:  Quitting smoking.  Exercising regularly.  Following a low-fat, low-cholesterol diet. Medicines may include:  Blood thinners to prevent blood clots.  Medicines to improve blood flow.  Medicines to improve your blood cholesterol levels. Surgical procedures may include:  A procedure that uses an inflated balloon to open a blocked artery and improve blood flow (angioplasty).  A procedure to put in a tube (stent) to keep a blocked artery open (stent implant).  Surgery to reroute blood flow around a blocked artery (peripheral bypass surgery).  Surgery to remove dead tissue from an infected wound on the affected limb.  Amputation. This is surgical removal of the affected limb. This may be necessary in cases of acute ischemic limb that are not improved through medical or surgical treatments. HOME CARE INSTRUCTIONS  Take medicines only as directed by your health care provider.  Do not use any tobacco products, including cigarettes, chewing tobacco, or electronic cigarettes. If you need help quitting, ask your health care provider.  Lose weight if you are overweight, and maintain a healthy weight as directed by your health care provider.  Eat a diet that is low in fat and cholesterol. If you need help, ask your health care provider.  Exercise regularly. Ask your health care provider to suggest some good activities for you.  Use compression stockings or other mechanical devices as directed by your health care provider.  Take good care of your feet.  Wear comfortable shoes that fit well.  Check your feet often for any cuts or sores. SEEK MEDICAL CARE IF:  You have cramps in your legs  while walking.  You have leg pain when you are at rest.  You have coldness in a leg or foot.  Your skin changes.  You have erectile dysfunction.  You have cuts or sores on your feet that are not healing. SEEK IMMEDIATE MEDICAL CARE IF:  Your arm or leg turns cold and blue.  Your arms or legs become red, warm, swollen, painful, or numb.  You have chest pain or trouble breathing.  You suddenly have weakness in your face, arm, or leg.  You become very confused or lose the ability to speak.  You suddenly have a very bad headache or lose your vision.   This information is not intended to replace advice given to you by your health care provider. Make sure you discuss any questions you have with your health care provider.   Document Released: 05/24/2004 Document Revised: 05/07/2014 Document Reviewed: 09/24/2013 Elsevier Interactive Patient Education 2016 Elsevier Inc.  

## 2015-09-15 NOTE — Progress Notes (Signed)
[facility]   CC: Follow up peripheral artery occlusive disease  History of Present Illness William Walters is a 82 y.o. male patient of Dr. Scot Dock who initially presented with gangrene of the right foot and was not a candidate for revascularization. He underwent a primary above-the-knee amputation given that he had a contracture of the right lower extremity. This was on 06/09/2015. Dr. Scot Dock last saw pt on 07/06/15. At that time his right above-the-knee amputation was healing nicely. His staples were removed in the office. He had no wounds on the left foot. Dr. Scot Dock requested left ABI in 6 months and see him back at that time. They know to call sooner if he has problems.  Pt returns today at the request of Dr. Bradd Burner with [facility] to evaluate left foot worsening ulcers. Nurse with pt states pt lives with his daughter Seth Bake. Pt has right arm contractures from a past stroke. He has right facial droop, he responds appropriately to commands but does not articulate. RN with pt states he does not walk, he is able to pivot on his left foot with assisitance, and states no reported issues with his right AKA stump.  Pt Diabetic: No Pt smoker: former smoker, quit ? years ago, RN with pt states pt has a hx of lung cancer  Pt meds include: Statin :No Betablocker: No ASA: No Other anticoagulants/antiplatelets: no   Past Medical History  Diagnosis Date  . Hypertension   . Stroke ([place])   . Alzheimer disease   . Cancer ([place])     Lung  . Headache     Social History Social History  Substance Use Topics  . Smoking status: Former Smoker    Types: Cigarettes  . Smokeless tobacco: Never Used  . Alcohol Use: No    Family History No family history on file.  Past Surgical History  Procedure Laterality Date  . Fracture surgery    . Amputation Right 06/09/2015    Procedure: AMPUTATION ABOVE KNEE;  Surgeon: Angelia Mould, MD;  Location: [facility] OR;  Service:  Vascular;  Laterality: Right;    No Known Allergies  Current Outpatient Prescriptions  Medication Sig Dispense Refill  . ENSURE PLUS (ENSURE PLUS) LIQD Take 237 mLs by mouth 2 (two) times daily between meals.    Marland Kitchen lisinopril (PRINIVIL,ZESTRIL) 20 MG tablet Take 1 tablet (20 mg total) by mouth daily.    . memantine (NAMENDA) 10 MG tablet Take 10 mg by mouth 2 (two) times daily.    Marland Kitchen nystatin cream (MYCOSTATIN) Apply 1 application topically 3 (three) times daily.    . polyethylene glycol (MIRALAX / GLYCOLAX) packet Take 17 g by mouth daily as needed for mild constipation.    . traZODone (DESYREL) 50 MG tablet Take 50 mg by mouth at bedtime.    Marland Kitchen aspirin 81 MG tablet Take 81 mg by mouth daily. Reported on 09/15/2015    . Cholecalciferol (VITAMIN D-3 PO) Take 50,000 Units by mouth every 30 (thirty) days. Reported on 09/15/2015    . donepezil (ARICEPT) 10 MG tablet Take 10 mg by mouth at bedtime. Reported on 09/15/2015    . oxyCODONE (OXY IR/ROXICODONE) 5 MG immediate release tablet Take 1 tablet (5 mg total) by mouth every 4 (four) hours as needed for moderate pain. (Patient not taking: Reported on 09/15/2015) 30 tablet 0   No current facility-administered medications for this visit.    ROS: See HPI for pertinent positives and negatives.   Physical Examination  Filed Vitals:   09/15/15 0948  BP: 96/61  Pulse: 83  Temp: 96.6 F (35.9 C)  TempSrc: Oral  Height: '6\' 1"'$  (1.854 m)  Weight: 130 lb (58.968 kg)  SpO2: 99%   Body mass index is 17.16 kg/(m^2).  General: A&O x 3, WDWN,  Gait: seated in wheelchair Eyes: Pupils are equal Pulmonary: Non labored respirations, CTAB but distant, without wheezes, rales, or rhonchi. Cardiac: regular rhythm, no detected murmur.         Carotid Bruits Right Left   Negative Negative  Aorta is not palpable. Radial pulses: faintly palpable bilaterally                           VASCULAR EXAM: Extremities with ischemic changes in left second toe  hammertoe PIP joint, appears abraded by shoe, ulcer depth to fascia, no bone visible. Also early stage abrasion with mild erythema at left 3rd and 4th toes. without Gangrene; with open wound as above.                                                                                                          LE Pulses Right Left       FEMORAL   palpable   palpable        POPLITEAL AKA   not palpable       POSTERIOR TIBIAL  AKA   biphasic by Doppler and not palpable        DORSALIS PEDIS      ANTERIOR TIBIAL AKA   monophasic by Doppler and not palpable        PERONEAL AKA   monophasic by Doppler and not Palpable    Abdomen: soft, NT, no palpable masses. Skin: no rashes, see Extremities. Musculoskeletal: contractures of right arm, see Extremities.   Neurologic: A&O X 3; Appropriate Affect ; SENSATION: normal; MOTOR FUNCTION:  Contracture of right arm; left UE and LE with 3/5 MS. Pt is non verbal but does follow commands. CN 2-12 intact except: right facial droop.    Non-Invasive Vascular Imaging: DATE: 09/15/2015 none  ASSESSMENT: William Walters is a 82 y.o. male who is s/p right above-the-knee amputation given that he had a contracture of the right lower extremity. This was on 06/09/2015. Dr. Scot Dock last saw pt on 07/06/15. At that time his right above-the-knee amputation was healing nicely. His staples were removed in the office. He had no wounds on the left foot.   Creatinine on 06/11/15 was 0.79 (review of records)  PLAN:  Based on the patient's vascular studies and examination, and after Dr. Bridgett Larsson spoke with pt and RN with pt, pt will be scheduled for angiogram of the left leg with Dr. Scot Dock, possible intervention.  I discussed in depth with the patient the nature of atherosclerosis, and emphasized the importance of maximal medical management including strict control of blood pressure, blood glucose, and lipid levels, obtaining regular exercise, and continued cessation of  smoking.  The patient is aware that without maximal medical management the underlying  atherosclerotic disease process will progress, limiting the benefit of any interventions.  The patient was given information about PAD including signs, symptoms, treatment, what symptoms should prompt the patient to seek immediate medical care, and risk reduction measures to take.  Clemon Chambers, RN, MSN, FNP-C [facility] Phone: [phone]  Clinic MD: Bridgett Larsson  09/15/2015 10:01 AM

## 2015-09-21 ENCOUNTER — Telehealth: Payer: Self-pay | Admitting: Vascular Surgery

## 2015-09-21 ENCOUNTER — Encounter (HOSPITAL_COMMUNITY)
Admission: RE | Disposition: A | Discharge status: Home / Self Care | Payer: Self-pay | Source: Ambulatory Visit | Attending: Surgery

## 2015-09-21 ENCOUNTER — Encounter (HOSPITAL_COMMUNITY): Payer: Self-pay | Admitting: Surgery

## 2015-09-21 ENCOUNTER — Ambulatory Visit (HOSPITAL_COMMUNITY)
Admission: RE | Admit: 2015-09-21 | Discharge: 2015-09-21 | Disposition: A | Discharge status: Home / Self Care | Payer: Medicare (Managed Care) | Source: Ambulatory Visit | Attending: Surgery | Admitting: Surgery

## 2015-09-21 DIAGNOSIS — Z87891 Personal history of nicotine dependence: Secondary | ICD-10-CM | POA: Insufficient documentation

## 2015-09-21 DIAGNOSIS — Z79899 Other long term (current) drug therapy: Secondary | ICD-10-CM | POA: Insufficient documentation

## 2015-09-21 DIAGNOSIS — Z85118 Personal history of other malignant neoplasm of bronchus and lung: Secondary | ICD-10-CM | POA: Diagnosis not present

## 2015-09-21 DIAGNOSIS — I69398 Other sequelae of cerebral infarction: Secondary | ICD-10-CM | POA: Diagnosis not present

## 2015-09-21 DIAGNOSIS — L97529 Non-pressure chronic ulcer of other part of left foot with unspecified severity: Secondary | ICD-10-CM | POA: Diagnosis present

## 2015-09-21 DIAGNOSIS — Z7982 Long term (current) use of aspirin: Secondary | ICD-10-CM | POA: Insufficient documentation

## 2015-09-21 DIAGNOSIS — I70245 Atherosclerosis of native arteries of left leg with ulceration of other part of foot: Secondary | ICD-10-CM | POA: Diagnosis not present

## 2015-09-21 DIAGNOSIS — M2450 Contracture, unspecified joint: Secondary | ICD-10-CM | POA: Insufficient documentation

## 2015-09-21 DIAGNOSIS — I10 Essential (primary) hypertension: Secondary | ICD-10-CM | POA: Diagnosis not present

## 2015-09-21 DIAGNOSIS — I69392 Facial weakness following cerebral infarction: Secondary | ICD-10-CM | POA: Diagnosis not present

## 2015-09-21 DIAGNOSIS — Z89611 Acquired absence of right leg above knee: Secondary | ICD-10-CM | POA: Insufficient documentation

## 2015-09-21 DIAGNOSIS — F0280 Dementia in other diseases classified elsewhere without behavioral disturbance: Secondary | ICD-10-CM | POA: Diagnosis not present

## 2015-09-21 DIAGNOSIS — G309 Alzheimer's disease, unspecified: Secondary | ICD-10-CM | POA: Diagnosis not present

## 2015-09-21 HISTORY — PX: PERIPHERAL VASCULAR CATHETERIZATION: SHX172C

## 2015-09-21 LAB — POCT I-STAT, CHEM 8
BUN: 47 mg/dL — AB (ref 6–20)
CALCIUM ION: 1.16 mmol/L (ref 1.13–1.30)
CREATININE: 1.4 mg/dL — AB (ref 0.61–1.24)
Chloride: 107 mmol/L (ref 101–111)
GLUCOSE: 101 mg/dL — AB (ref 65–99)
HCT: 44 % (ref 39.0–52.0)
Hemoglobin: 15 g/dL (ref 13.0–17.0)
Potassium: 4.9 mmol/L (ref 3.5–5.1)
SODIUM: 144 mmol/L (ref 135–145)
TCO2: 28 mmol/L (ref 0–100)

## 2015-09-21 SURGERY — LOWER EXTREMITY ANGIOGRAPHY
Anesthesia: LOCAL

## 2015-09-21 MED ORDER — LIDOCAINE HCL (PF) 1 % IJ SOLN
INTRAMUSCULAR | Status: AC
  Filled 2015-09-21: qty 30

## 2015-09-21 MED ORDER — HEPARIN SODIUM (PORCINE) 1000 UNIT/ML IJ SOLN
INTRAMUSCULAR | Status: DC | PRN
  Administered 2015-09-21: 1000 [IU] via INTRAVENOUS

## 2015-09-21 MED ORDER — ONDANSETRON HCL 4 MG/2ML IJ SOLN: 4.0000 mg | Freq: Four times a day (QID) | INTRAMUSCULAR | Status: DC | PRN

## 2015-09-21 MED ORDER — HEPARIN (PORCINE) IN NACL 2-0.9 UNIT/ML-% IJ SOLN
INTRAMUSCULAR | Status: AC
  Filled 2015-09-21: qty 1000

## 2015-09-21 MED ORDER — PHENOL 1.4 % MT LIQD: 1.0000 | OROMUCOSAL | Status: DC | PRN

## 2015-09-21 MED ORDER — SODIUM CHLORIDE 0.9 % IV SOLN
INTRAVENOUS | Status: DC
  Administered 2015-09-21: 07:00:00 via INTRAVENOUS

## 2015-09-21 MED ORDER — SODIUM CHLORIDE 0.9 % IV SOLN: 1.0000 mL/kg/h | INTRAVENOUS | Status: DC

## 2015-09-21 MED ORDER — IODIXANOL 320 MG/ML IV SOLN
INTRAVENOUS | Status: DC | PRN
  Administered 2015-09-21: 125 mL via INTRA_ARTERIAL

## 2015-09-21 MED ORDER — ALUM & MAG HYDROXIDE-SIMETH 200-200-20 MG/5ML PO SUSP: 15.0000 mL | ORAL | Status: DC | PRN

## 2015-09-21 MED ORDER — DOCUSATE SODIUM 100 MG PO CAPS: 100.0000 mg | ORAL_CAPSULE | Freq: Every day | ORAL | Status: DC

## 2015-09-21 MED ORDER — LABETALOL HCL 5 MG/ML IV SOLN: 10.0000 mg | INTRAVENOUS | Status: DC | PRN

## 2015-09-21 MED ORDER — LIDOCAINE HCL (PF) 1 % IJ SOLN
INTRAMUSCULAR | Status: DC | PRN
  Administered 2015-09-21: 10 mL via SUBCUTANEOUS

## 2015-09-21 MED ORDER — METOPROLOL TARTRATE 5 MG/5ML IV SOLN: 2.0000 mg | INTRAVENOUS | Status: DC | PRN

## 2015-09-21 MED ORDER — HYDRALAZINE HCL 20 MG/ML IJ SOLN: 5.0000 mg | INTRAMUSCULAR | Status: DC | PRN

## 2015-09-21 MED ORDER — ACETAMINOPHEN 325 MG PO TABS: 325.0000 mg | ORAL_TABLET | ORAL | Status: DC | PRN

## 2015-09-21 MED ORDER — GUAIFENESIN-DM 100-10 MG/5ML PO SYRP: 15.0000 mL | ORAL_SOLUTION | ORAL | Status: DC | PRN

## 2015-09-21 MED ORDER — ACETAMINOPHEN 325 MG RE SUPP: 325.0000 mg | RECTAL | Status: DC | PRN

## 2015-09-21 SURGICAL SUPPLY — 10 items
CATH OMNI FLUSH 5F 65CM (CATHETERS) ×2 IMPLANT
DEVICE TORQUE H2O (MISCELLANEOUS) ×2 IMPLANT
GUIDEWIRE ANGLED .035X150CM (WIRE) ×2 IMPLANT
KIT MICROINTRODUCER STIFF 5F (SHEATH) ×2 IMPLANT
KIT PV (KITS) ×2 IMPLANT
SHEATH PINNACLE 5F 10CM (SHEATH) ×2 IMPLANT
SYR MEDRAD MARK V 150ML (SYRINGE) ×2 IMPLANT
TRANSDUCER W/STOPCOCK (MISCELLANEOUS) ×2 IMPLANT
TRAY PV CATH (CUSTOM PROCEDURE TRAY) ×2 IMPLANT
WIRE BENTSON .035X145CM (WIRE) ×2 IMPLANT

## 2015-09-21 NOTE — Interval H&P Note (Signed)
History and Physical Interval Note:  09/21/2015 8:48 AM  William Walters  has presented today for surgery, with the diagnosis of Left Second Toe Ulcer  The various methods of treatment have been discussed with the patient and family. After consideration of risks, benefits and other options for treatment, the patient has consented to  Procedure(s): Lower Extremity Angiography - Left Leg (N/A) as a surgical intervention .  The patient's history has been reviewed, patient examined, no change in status, stable for surgery.  I have reviewed the patient's chart and labs.  Questions were answered to the patient's satisfaction.     Annamarie Major

## 2015-09-21 NOTE — Progress Notes (Signed)
Site area:  RFA Site Prior to Removal:  Level 0 Pressure Applied For:89mn Manual:    yes Patient Status During Pull: stable Post Pull Site:  Level Post Pull Instructions Given:yes Post Pull Pulses Present: AKA Dressing Applied: tegaderm Bedrest begins @ 1000 till 1400 Comments:removed by GLaqueta Due

## 2015-09-21 NOTE — Discharge Instructions (Signed)
Angiogram, Care After °Refer to this sheet in the next few weeks. These instructions provide you with information about caring for yourself after your procedure. Your health care provider may also give you more specific instructions. Your treatment has been planned according to current medical practices, but problems sometimes occur. Call your health care provider if you have any problems or questions after your procedure. °WHAT TO EXPECT AFTER THE PROCEDURE °After your procedure, it is typical to have the following: °· Bruising at the catheter insertion site that usually fades within 1-2 weeks. °· Blood collecting in the tissue (hematoma) that may be painful to the touch. It should usually decrease in size and tenderness within 1-2 weeks. °HOME CARE INSTRUCTIONS °· Take medicines only as directed by your health care provider. °· You may shower 24-48 hours after the procedure or as directed by your health care provider. Remove the bandage (dressing) and gently wash the site with plain soap and water. Pat the area dry with a clean towel. Do not rub the site, because this may cause bleeding. °· Do not take baths, swim, or use a hot tub until your health care provider approves. °· Check your insertion site every day for redness, swelling, or drainage. °· Do not apply powder or lotion to the site. °· Do not lift over 10 lb (4.5 kg) for 5 days after your procedure or as directed by your health care provider. °· Ask your health care provider when it is okay to: °¨ Return to work or school. °¨ Resume usual physical activities or sports. °¨ Resume sexual activity. °· Do not drive home if you are discharged the same day as the procedure. Have someone else drive you. °· You may drive 24 hours after the procedure unless otherwise instructed by your health care provider. °· Do not operate machinery or power tools for 24 hours after the procedure or as directed by your health care provider. °· If your procedure was done as an  outpatient procedure, which means that you went home the same day as your procedure, a responsible adult should be with you for the first 24 hours after you arrive home. °· Keep all follow-up visits as directed by your health care provider. This is important. °SEEK MEDICAL CARE IF: °· You have a fever. °· You have chills. °· You have increased bleeding from the catheter insertion site. Hold pressure on the site.  CALL 911 °SEEK IMMEDIATE MEDICAL CARE IF: °· You have unusual pain at the catheter insertion site. °· You have redness, warmth, or swelling at the catheter insertion site. °· You have drainage (other than a small amount of blood on the dressing) from the catheter insertion site. °· The catheter insertion site is bleeding, and the bleeding does not stop after 30 minutes of holding steady pressure on the site. °· The area near or just beyond the catheter insertion site becomes pale, cool, tingly, or numb. °  °This information is not intended to replace advice given to you by your health care provider. Make sure you discuss any questions you have with your health care provider. °  °Document Released: 11/02/2004 Document Revised: 05/07/2014 Document Reviewed: 09/17/2012 °Elsevier Interactive Patient Education ©2016 Elsevier Inc. ° °

## 2015-09-21 NOTE — Progress Notes (Signed)
Per prior documentation, unable to capture spo2 or pulse rate with monitor

## 2015-09-21 NOTE — Telephone Encounter (Signed)
-----   Message from Mena Goes, RN sent at 09/21/2015 10:23 AM EDT ----- Regarding: schedule   ----- Message -----    From: Serafina Mitchell, MD    Sent: 09/21/2015   9:29 AM      To: [facility] Charge Pool  09/21/2015:  Surgeon:  Annamarie Major Procedure Performed:  1.  Ultrasound-guided access, right femoral artery  2.  Abdominal aortogram  3.  Left lower extremity runoff  4.  Second order catheterization    Please have the patient follow-up with Dr. Bridgett Larsson or Scot Dock for a wound check and consideration of primary amputation within a 4-6 week

## 2015-09-21 NOTE — Telephone Encounter (Signed)
Sched appt 6/23 at 1:30. Spoke w/ pt's daughter and [facility] to inform them of appt.

## 2015-09-21 NOTE — Op Note (Signed)
    Patient name: William Walters MRN: [id number] DOB: 1933-01-28 Sex: male  09/21/2015 Pre-operative Diagnosis: Left Foot ulcer Post-operative diagnosis:  Same Surgeon:  Annamarie Major Procedure Performed:  1.  Ultrasound-guided access, right femoral artery  2.  Abdominal aortogram  3.  Left lower extremity runoff  4.  Second order catheterization    Indications:  The patient was originally seen by Dr. Scot Dock for a right toe wound.  He was felt to not be a candidate for revascularization and therefore underwent primary right above-knee amputation.  The patient returns with a new left foot ulcer and pain.  He was seen and evaluated by Dr. Bridgett Larsson and Vinnie Level.  He was scheduled for angiography to determine options for possible revascularization.  Patient has a history of stroke and dementia.  He does not ambulate.  He uses the left leg for transfers.  Procedure:  The patient was identified in the holding area and taken to room 8.  The patient was then placed supine on the table and prepped and draped in the usual sterile fashion.  A time out was called.  Ultrasound was used to evaluate the right common femoral artery.  It was patent .  A digital ultrasound image was acquired.  A micropuncture needle was used to access the right common femoral artery under ultrasound guidance.  An 018 wire was advanced without resistance and a micropuncture sheath was placed.  The 018 wire was removed and a benson wire was placed.  The micropuncture sheath was exchanged for a 5 french sheath.  An omniflush catheter was advanced over the wire to the level of L-1.  An abdominal angiogram was obtained.  Next, using the omniflush catheter and a benson wire, the aortic bifurcation was crossed and the catheter was placed into theleft external iliac artery and left runoff was obtained.    Findings:   Aortogram:  No significant renal artery stenosis.  The infrarenal abdominal aorta is widely patent.  Bilateral common iliac  arteries are calcified but widely patent.  The right external iliac artery is widely patent.  Left hypogastric artery is occluded.  The left external iliac artery is patent but smaller in caliber and the common.  Right Lower Extremity:  Not evaluated  Left Lower Extremity:  There is a near flush occlusion of the left superficial femoral artery.  The common femoral profunda femoral artery are widely patent.  There is reconstitution of the distal superficial femoral artery at the level of the adductor canal with severe calcification.  The popliteal artery is patent throughout it's course.  There is single vessel runoff via the peroneal artery.  Intervention:  None  Impression:  #1  near flush occlusion of the left superficial femoral artery with reconstitution of the distal superficial femoral artery and single vessel runoff via the peroneal  #2  patient is not a good candidate for percutaneous intervention as he cannot sit still and he has a long segment occlusion with single-vessel runoff  #3  no significant pressure gradient noted within the left external iliac artery    V. Annamarie Major, M.D. [facility] Office: [phone] Pager:  [phone]

## 2015-09-21 NOTE — H&P (View-Only) (Signed)
[facility] [place]   CC: Follow up peripheral artery occlusive disease  History of Present Illness William Walters is a 82 y.o. male patient of Dr. Scot Dock who initially presented with gangrene of the right foot and was not a candidate for revascularization. He underwent a primary above-the-knee amputation given that he had a contracture of the right lower extremity. This was on 06/09/2015. Dr. Scot Dock last saw pt on 07/06/15. At that time his right above-the-knee amputation was healing nicely. His staples were removed in the office. He had no wounds on the left foot. Dr. Scot Dock requested left ABI in 6 months and see him back at that time. They know to call sooner if he has problems.  Pt returns today at the request of Dr. Bradd Burner with [facility] to evaluate left foot worsening ulcers. Nurse with pt states pt lives with his daughter Seth Bake. Pt has right arm contractures from a past stroke. He has right facial droop, he responds appropriately to commands but does not articulate. RN with pt states he does not walk, he is able to pivot on his left foot with assisitance, and states no reported issues with his right AKA stump.  Pt Diabetic: No Pt smoker: former smoker, quit ? years ago, RN with pt states pt has a hx of lung cancer  Pt meds include: Statin :No Betablocker: No ASA: No Other anticoagulants/antiplatelets: no   Past Medical History  Diagnosis Date  . Hypertension   . Stroke ([place])   . Alzheimer disease   . Cancer ([place])     Lung  . Headache     Social History Social History  Substance Use Topics  . Smoking status: Former Smoker    Types: Cigarettes  . Smokeless tobacco: Never Used  . Alcohol Use: No    Family History No family history on file.  Past Surgical History  Procedure Laterality Date  . Fracture surgery    . Amputation Right 06/09/2015    Procedure: AMPUTATION ABOVE KNEE;  Surgeon: Angelia Mould, MD;  Location: [facility] OR;  Service:  Vascular;  Laterality: Right;    No Known Allergies  Current Outpatient Prescriptions  Medication Sig Dispense Refill  . ENSURE PLUS (ENSURE PLUS) LIQD Take 237 mLs by mouth 2 (two) times daily between meals.    Marland Kitchen lisinopril (PRINIVIL,ZESTRIL) 20 MG tablet Take 1 tablet (20 mg total) by mouth daily.    . memantine (NAMENDA) 10 MG tablet Take 10 mg by mouth 2 (two) times daily.    Marland Kitchen nystatin cream (MYCOSTATIN) Apply 1 application topically 3 (three) times daily.    . polyethylene glycol (MIRALAX / GLYCOLAX) packet Take 17 g by mouth daily as needed for mild constipation.    . traZODone (DESYREL) 50 MG tablet Take 50 mg by mouth at bedtime.    Marland Kitchen aspirin 81 MG tablet Take 81 mg by mouth daily. Reported on 09/15/2015    . Cholecalciferol (VITAMIN D-3 PO) Take 50,000 Units by mouth every 30 (thirty) days. Reported on 09/15/2015    . donepezil (ARICEPT) 10 MG tablet Take 10 mg by mouth at bedtime. Reported on 09/15/2015    . oxyCODONE (OXY IR/ROXICODONE) 5 MG immediate release tablet Take 1 tablet (5 mg total) by mouth every 4 (four) hours as needed for moderate pain. (Patient not taking: Reported on 09/15/2015) 30 tablet 0   No current facility-administered medications for this visit.    ROS: See HPI for pertinent positives and negatives.   Physical Examination  Filed Vitals:   09/15/15 0948  BP: 96/61  Pulse: 83  Temp: 96.6 F (35.9 C)  TempSrc: Oral  Height: '6\' 1"'$  (1.854 m)  Weight: 130 lb (58.968 kg)  SpO2: 99%   Body mass index is 17.16 kg/(m^2).  General: A&O x 3, WDWN,  Gait: seated in wheelchair Eyes: Pupils are equal Pulmonary: Non labored respirations, CTAB but distant, without wheezes, rales, or rhonchi. Cardiac: regular rhythm, no detected murmur.         Carotid Bruits Right Left   Negative Negative  Aorta is not palpable. Radial pulses: faintly palpable bilaterally                           VASCULAR EXAM: Extremities with ischemic changes in left second toe  hammertoe PIP joint, appears abraded by shoe, ulcer depth to fascia, no bone visible. Also early stage abrasion with mild erythema at left 3rd and 4th toes. without Gangrene; with open wound as above.                                                                                                          LE Pulses Right Left       FEMORAL   palpable   palpable        POPLITEAL AKA   not palpable       POSTERIOR TIBIAL  AKA   biphasic by Doppler and not palpable        DORSALIS PEDIS      ANTERIOR TIBIAL AKA   monophasic by Doppler and not palpable        PERONEAL AKA   monophasic by Doppler and not Palpable    Abdomen: soft, NT, no palpable masses. Skin: no rashes, see Extremities. Musculoskeletal: contractures of right arm, see Extremities.   Neurologic: A&O X 3; Appropriate Affect ; SENSATION: normal; MOTOR FUNCTION:  Contracture of right arm; left UE and LE with 3/5 MS. Pt is non verbal but does follow commands. CN 2-12 intact except: right facial droop.    Non-Invasive Vascular Imaging: DATE: 09/15/2015 none  ASSESSMENT: Alixander Walters is a 82 y.o. male who is s/p right above-the-knee amputation given that he had a contracture of the right lower extremity. This was on 06/09/2015. Dr. Scot Dock last saw pt on 07/06/15. At that time his right above-the-knee amputation was healing nicely. His staples were removed in the office. He had no wounds on the left foot.   Creatinine on 06/11/15 was 0.79 (review of records)  PLAN:  Based on the patient's vascular studies and examination, and after Dr. Bridgett Larsson spoke with pt and RN with pt, pt will be scheduled for angiogram of the left leg with Dr. Scot Dock, possible intervention.  I discussed in depth with the patient the nature of atherosclerosis, and emphasized the importance of maximal medical management including strict control of blood pressure, blood glucose, and lipid levels, obtaining regular exercise, and continued cessation of  smoking.  The patient is aware that without maximal medical management the underlying  atherosclerotic disease process will progress, limiting the benefit of any interventions.  The patient was given information about PAD including signs, symptoms, treatment, what symptoms should prompt the patient to seek immediate medical care, and risk reduction measures to take.  Clemon Chambers, RN, MSN, FNP-C [facility] Phone: [phone]  Clinic MD: Bridgett Larsson  09/15/2015 10:01 AM

## 2015-09-22 MED FILL — Heparin Sodium (Porcine) 2 Unit/ML in Sodium Chloride 0.9%: INTRAMUSCULAR | Qty: 1000 | Status: AC

## 2015-10-13 ENCOUNTER — Encounter: Payer: Self-pay | Admitting: Vascular Surgery

## 2015-10-14 ENCOUNTER — Ambulatory Visit
Admission: RE | Admit: 2015-10-14 | Discharge: 2015-10-14 | Disposition: A | Discharge status: Home / Self Care | Payer: Medicare (Managed Care) | Source: Ambulatory Visit | Attending: Nurse Practitioner | Admitting: Nurse Practitioner

## 2015-10-14 ENCOUNTER — Other Ambulatory Visit: Payer: Self-pay | Admitting: Nurse Practitioner

## 2015-10-14 DIAGNOSIS — M869 Osteomyelitis, unspecified: Secondary | ICD-10-CM

## 2015-10-19 NOTE — Progress Notes (Signed)
Established Critical Limb Ischemia Patient  History of Present Illness  William Walters is a 82 y.o. (Feb 15, 1933) male who presents with chief complaint: left 2nd toe ucler.  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  The patient recently under angiography with Dr. Trula Slade who felt he was not a candidate intra-procedure for endovascular revascularization.  The patient's L foot wounds have: clean up some per the patient.  Conversation with patient is limited due to the patient's dementia.    Past Medical History  Diagnosis Date  . Hypertension   . Stroke ([place])   . Alzheimer disease   . Cancer ([place])     Lung  . Headache     Past Surgical History  Procedure Laterality Date  . Fracture surgery    . Amputation Right 06/09/2015    Procedure: AMPUTATION ABOVE KNEE;  Surgeon: Angelia Mould, MD;  Location: [facility];  Service: Vascular;  Laterality: Right;  . Peripheral vascular catheterization N/A 09/21/2015    Procedure: Lower Extremity Angiography - Left Leg;  Surgeon: Serafina Mitchell, MD;  Location: [facility];  Service: Cardiovascular;  Laterality: N/A;    Social History   Social History  . Marital Status: Single    Spouse Name: N/A  . Number of Children: N/A  . Years of Education: N/A   Occupational History  . Not on file.   Social History Main Topics  . Smoking status: Former Smoker    Types: Cigarettes  . Smokeless tobacco: Never Used  . Alcohol Use: No  . Drug Use: No  . Sexual Activity: Not on file   Other Topics Concern  . Not on file   Social History Narrative   Family History: patient is unable to detail the medical history of his parents  Current Outpatient Prescriptions  Medication Sig Dispense Refill  . aspirin 81 MG tablet Take 81 mg by mouth daily. Reported on 09/15/2015    . Cholecalciferol (VITAMIN D-3 PO) Take 50,000 Units by mouth every 30 (thirty) days. Reported on 09/15/2015      . donepezil (ARICEPT) 10 MG tablet Take 10 mg by mouth at bedtime. Reported on 09/15/2015    . ENSURE PLUS (ENSURE PLUS) LIQD Take 237 mLs by mouth 2 (two) times daily between meals.    Marland Kitchen lisinopril (PRINIVIL,ZESTRIL) 20 MG tablet Take 1 tablet (20 mg total) by mouth daily.    . memantine (NAMENDA) 10 MG tablet Take 10 mg by mouth 2 (two) times daily.    Marland Kitchen nystatin cream (MYCOSTATIN) Apply 1 application topically 3 (three) times daily as needed for dry skin.     Marland Kitchen oxyCODONE (OXY IR/ROXICODONE) 5 MG immediate release tablet Take 1 tablet (5 mg total) by mouth every 4 (four) hours as needed for moderate pain. (Patient not taking: Reported on 09/15/2015) 30 tablet 0  . polyethylene glycol (MIRALAX / GLYCOLAX) packet Take 17 g by mouth daily as needed for mild constipation.    . traZODone (DESYREL) 50 MG tablet Take 50 mg by mouth at bedtime.     No current facility-administered medications for this visit.     No Known Allergies   REVIEW OF SYSTEMS:  (Positives checked otherwise negative)  Unable to obtain due to dementia  Physical Examination  Filed Vitals:   10/21/15 1127  BP: 136/82  Pulse: 102  Temp: 97.1 F (36.2 C)  Resp: 16  SpO2: 96%   There is no  weight on file to calculate BMI.  General: A&O x 3, WDWN, limited conversation due to dementia  Eyes: PERRL, EOMI  Pulmonary: Sym exp, good air movt, CTAB, no rales, rhonchi, & wheezing  Cardiac: RRR, Nl S1, S2, no Murmurs, rubs or gallops  Vascular: Vessel Right Left  Radial Palpable Palpable  Brachial Palpable Palpable  Carotid Palpable, without bruit Palpable, without bruit  Aorta Not palpable N/A  Femoral Palpable Palpable  Popliteal AKA Not palpable  PT AKA Not Palpable  DP AKA Not Palpable   Gastrointestinal: soft, NTND, no G/R, no HSM, no masses, no CVAT B  Musculoskeletal: Limited motor exam due to poor cooperation, R AKA, L foot: exposed L interphalangeal joint, no pus, no erythema, no TTP, start of ulcer on  L great toe  Neurologic: Limited exam due to dementia  Medical Decision Making  William Walters is a 82 y.o. male who presents with: LLE critical limb ischemia with exposed L 2nd interphalangeal joint, s/p R AKA   Somewhat difficult discussion due to the patient's dementia.  It seem apparent, however, that the patient is not interested in amputation at this point  Based on the patient's vascular studies and examination, I have offered the patient: L AKA.  He declined.  I would manage his wounds in a palliative fashion: pain medications as needle, wound care daily, and abx as needed.  I would paint the left foot wound with betadine and keep the wound bandaged daily.  In the event, the patient worsens or agrees to proceed with the L AKA, please contact us to schedule.  Thank you for allowing [place] to participate in this patient's care.   [name], MD, FACS [facility] [place] Office: [phone] Pager: [phone]

## 2015-10-21 ENCOUNTER — Ambulatory Visit (INDEPENDENT_AMBULATORY_CARE_PROVIDER_SITE_OTHER): Payer: Medicare (Managed Care) | Admitting: Vascular Surgery

## 2015-10-21 ENCOUNTER — Encounter: Payer: Self-pay | Admitting: Vascular Surgery

## 2015-10-21 VITALS — BP 136/82 | HR 102 | Temp 97.1°F | Resp 16

## 2015-10-21 DIAGNOSIS — I70269 Atherosclerosis of native arteries of extremities with gangrene, unspecified extremity: Secondary | ICD-10-CM | POA: Insufficient documentation

## 2015-10-21 DIAGNOSIS — I70262 Atherosclerosis of native arteries of extremities with gangrene, left leg: Secondary | ICD-10-CM | POA: Diagnosis not present

## 2015-10-21 DIAGNOSIS — Z89619 Acquired absence of unspecified leg above knee: Secondary | ICD-10-CM | POA: Insufficient documentation

## 2015-10-21 DIAGNOSIS — Z89611 Acquired absence of right leg above knee: Secondary | ICD-10-CM

## 2015-11-07 ENCOUNTER — Other Ambulatory Visit: Payer: Self-pay | Admitting: *Deleted

## 2015-11-10 NOTE — Pre-Procedure Instructions (Signed)
William Walters  11/10/2015      [facility] [phone] - [address], [address] [place] [address] Phone: [phone] Fax: [phone]    Your procedure is scheduled on July 18  Report to [place] at 530 A.M.  Call this number if you have problems the morning of surgery:  [phone]   Remember:  Do not eat food or drink liquids after midnight.  Take these medicines the morning of surgery with A SIP OF WATER Memantine (Namenda)  Stop taking aspirin, BC's, Goody's, Herbal medications, Fish Oil, Ibuprofen, Advil, Motrin, Aleve, Vitamins   Do not wear jewelry, make-up or nail polish.  Do not wear lotions, powders, or perfumes.  You may wear deoderant.  Do not shave 48 hours prior to surgery.  Men may shave face and neck.  Do not bring valuables to the hospital.  [facility] is not responsible for any belongings or valuables.  Contacts, dentures or bridgework may not be worn into surgery.  Leave your suitcase in the car.  After surgery it may be brought to your room.  For patients admitted to the hospital, discharge time will be determined by your treatment team.  Patients discharged the day of surgery will not be allowed to drive home.   Special instructions:  [place] [facility] - Preparing for Surgery  Before surgery, you can play an important role.  Because skin is not sterile, your skin needs to be as free of germs as possible.  You can reduce the number of germs on you skin by washing with CHG (chlorahexidine gluconate) soap before surgery.  CHG is an antiseptic cleaner which kills germs and bonds with the skin to continue killing germs even after washing.  Please DO NOT use if you have an allergy to CHG or antibacterial soaps.  If your skin becomes reddened/irritated stop using the CHG and inform your nurse when you arrive at Short Stay.  Do not shave (including legs and  underarms) for at least 48 hours prior to the first CHG shower.  You may shave your face.  Please follow these instructions carefully:   1.  Shower with CHG Soap the night before surgery and the                                morning of Surgery.  2.  If you choose to wash your hair, wash your hair first as usual with your       normal shampoo.  3.  After you shampoo, rinse your hair and body thoroughly to remove the                      Shampoo.  4.  Use CHG as you would any other liquid soap.  You can apply chg directly       to the skin and wash gently with scrungie or a clean washcloth.  5.  Apply the CHG Soap to your body ONLY FROM THE NECK DOWN.        Do not use on open wounds or open sores.  Avoid contact with your eyes,       ears, mouth and genitals (private parts).  Wash genitals (private parts)       with your normal soap.  6.  Wash thoroughly, paying  special attention to the area where your surgery        will be performed.  7.  Thoroughly rinse your body with warm water from the neck down.  8.  DO NOT shower/wash with your normal soap after using and rinsing off       the CHG Soap.  9.  Pat yourself dry with a clean towel.            10.  Wear clean pajamas.            11.  Place clean sheets on your bed the night of your first shower and do not        sleep with pets.  Day of Surgery  Do not apply any lotions/deoderants the morning of surgery.  Please wear clean clothes to the hospital/surgery center.     Please read over the following fact sheets that you were given. Pain Booklet, Coughing and Deep Breathing, MRSA Information and Surgical Site Infection Prevention

## 2015-11-11 ENCOUNTER — Emergency Department (HOSPITAL_COMMUNITY): Payer: Medicare (Managed Care)

## 2015-11-11 ENCOUNTER — Observation Stay (HOSPITAL_BASED_OUTPATIENT_CLINIC_OR_DEPARTMENT_OTHER)
Admission: EM | Admit: 2015-11-11 | Discharge: 2015-11-12 | Disposition: A | Discharge status: Skilled Nursing Facility | Payer: Medicare (Managed Care) | Source: Home / Self Care | Attending: Emergency Medicine | Admitting: Emergency Medicine

## 2015-11-11 ENCOUNTER — Encounter (HOSPITAL_COMMUNITY)
Admission: RE | Admit: 2015-11-11 | Discharge: 2015-11-11 | Disposition: A | Discharge status: Home / Self Care | Payer: Medicare (Managed Care) | Source: Ambulatory Visit | Attending: Vascular Surgery | Admitting: Vascular Surgery

## 2015-11-11 ENCOUNTER — Encounter (HOSPITAL_COMMUNITY): Payer: Self-pay

## 2015-11-11 ENCOUNTER — Other Ambulatory Visit: Payer: Self-pay

## 2015-11-11 DIAGNOSIS — L8990 Pressure ulcer of unspecified site, unspecified stage: Secondary | ICD-10-CM | POA: Insufficient documentation

## 2015-11-11 DIAGNOSIS — N390 Urinary tract infection, site not specified: Secondary | ICD-10-CM | POA: Diagnosis not present

## 2015-11-11 DIAGNOSIS — Z85118 Personal history of other malignant neoplasm of bronchus and lung: Secondary | ICD-10-CM

## 2015-11-11 DIAGNOSIS — E869 Volume depletion, unspecified: Secondary | ICD-10-CM

## 2015-11-11 DIAGNOSIS — I10 Essential (primary) hypertension: Secondary | ICD-10-CM

## 2015-11-11 DIAGNOSIS — G309 Alzheimer's disease, unspecified: Secondary | ICD-10-CM

## 2015-11-11 DIAGNOSIS — I739 Peripheral vascular disease, unspecified: Secondary | ICD-10-CM

## 2015-11-11 DIAGNOSIS — F0150 Vascular dementia without behavioral disturbance: Secondary | ICD-10-CM

## 2015-11-11 DIAGNOSIS — B9689 Other specified bacterial agents as the cause of diseases classified elsewhere: Secondary | ICD-10-CM | POA: Diagnosis not present

## 2015-11-11 DIAGNOSIS — F0280 Dementia in other diseases classified elsewhere without behavioral disturbance: Secondary | ICD-10-CM | POA: Insufficient documentation

## 2015-11-11 DIAGNOSIS — Z89612 Acquired absence of left leg above knee: Secondary | ICD-10-CM

## 2015-11-11 DIAGNOSIS — A419 Sepsis, unspecified organism: Secondary | ICD-10-CM | POA: Insufficient documentation

## 2015-11-11 DIAGNOSIS — Z7982 Long term (current) use of aspirin: Secondary | ICD-10-CM | POA: Insufficient documentation

## 2015-11-11 DIAGNOSIS — I959 Hypotension, unspecified: Secondary | ICD-10-CM | POA: Diagnosis not present

## 2015-11-11 DIAGNOSIS — I96 Gangrene, not elsewhere classified: Secondary | ICD-10-CM

## 2015-11-11 DIAGNOSIS — Z01812 Encounter for preprocedural laboratory examination: Secondary | ICD-10-CM | POA: Diagnosis present

## 2015-11-11 DIAGNOSIS — N3000 Acute cystitis without hematuria: Secondary | ICD-10-CM | POA: Insufficient documentation

## 2015-11-11 DIAGNOSIS — R55 Syncope and collapse: Secondary | ICD-10-CM | POA: Insufficient documentation

## 2015-11-11 DIAGNOSIS — R918 Other nonspecific abnormal finding of lung field: Secondary | ICD-10-CM | POA: Diagnosis not present

## 2015-11-11 DIAGNOSIS — Z66 Do not resuscitate: Secondary | ICD-10-CM | POA: Insufficient documentation

## 2015-11-11 DIAGNOSIS — G4700 Insomnia, unspecified: Secondary | ICD-10-CM | POA: Insufficient documentation

## 2015-11-11 DIAGNOSIS — Z8673 Personal history of transient ischemic attack (TIA), and cerebral infarction without residual deficits: Secondary | ICD-10-CM | POA: Insufficient documentation

## 2015-11-11 DIAGNOSIS — Z89511 Acquired absence of right leg below knee: Secondary | ICD-10-CM

## 2015-11-11 HISTORY — DX: Moderate protein-calorie malnutrition: E44.0

## 2015-11-11 LAB — GLUCOSE, CAPILLARY: GLUCOSE-CAPILLARY: 130 mg/dL — AB (ref 65–99)

## 2015-11-11 LAB — CBC
HEMATOCRIT: 39.8 % (ref 39.0–52.0)
Hemoglobin: 13.1 g/dL (ref 13.0–17.0)
MCH: 29.6 pg (ref 26.0–34.0)
MCHC: 32.9 g/dL (ref 30.0–36.0)
MCV: 90 fL (ref 78.0–100.0)
Platelets: 224 10*3/uL (ref 150–400)
RBC: 4.42 MIL/uL (ref 4.22–5.81)
RDW: 15.2 % (ref 11.5–15.5)
WBC: 19.1 10*3/uL — ABNORMAL HIGH (ref 4.0–10.5)

## 2015-11-11 LAB — CBG MONITORING, ED: GLUCOSE-CAPILLARY: 86 mg/dL (ref 65–99)

## 2015-11-11 LAB — POCT I-STAT, CHEM 8
BUN: 22 mg/dL — ABNORMAL HIGH (ref 6–20)
CREATININE: 0.9 mg/dL (ref 0.61–1.24)
Calcium, Ion: 1.19 mmol/L (ref 1.12–1.23)
Chloride: 104 mmol/L (ref 101–111)
GLUCOSE: 140 mg/dL — AB (ref 65–99)
HEMATOCRIT: 40 % (ref 39.0–52.0)
HEMOGLOBIN: 13.6 g/dL (ref 13.0–17.0)
Potassium: 4.1 mmol/L (ref 3.5–5.1)
Sodium: 141 mmol/L (ref 135–145)
TCO2: 26 mmol/L (ref 0–100)

## 2015-11-11 LAB — I-STAT CG4 LACTIC ACID, ED
LACTIC ACID, VENOUS: 3.04 mmol/L — AB (ref 0.5–1.9)
LACTIC ACID, VENOUS: 2.76 mmol/L — AB (ref 0.5–1.9)

## 2015-11-11 LAB — BASIC METABOLIC PANEL
Anion gap: 7 (ref 5–15)
BUN: 19 mg/dL (ref 6–20)
CO2: 28 mmol/L (ref 22–32)
Calcium: 9.3 mg/dL (ref 8.9–10.3)
Chloride: 105 mmol/L (ref 101–111)
Creatinine, Ser: 0.91 mg/dL (ref 0.61–1.24)
GFR calc Af Amer: >60 mL/min (ref >60)
GFR calc non Af Amer: >60 mL/min (ref >60)
Glucose, Bld: 114 mg/dL — ABNORMAL HIGH (ref 65–99)
Potassium: 4.2 mmol/L (ref 3.5–5.1)
Sodium: 140 mmol/L (ref 135–145)

## 2015-11-11 LAB — LACTIC ACID, PLASMA: Lactic Acid, Venous: 2.7 mmol/L (ref 0.5–1.9)

## 2015-11-11 LAB — I-STAT TROPONIN, ED: Troponin i, poc: 0.02 ng/mL (ref 0.00–0.08)

## 2015-11-11 MED ORDER — VANCOMYCIN HCL 500 MG IV SOLR
500.0000 mg | Freq: Two times a day (BID) | INTRAVENOUS | Status: DC
  Filled 2015-11-11: qty 500

## 2015-11-11 MED ORDER — PIPERACILLIN-TAZOBACTAM 3.375 G IVPB 30 MIN
3.3750 g | Freq: Once | INTRAVENOUS | Status: AC
  Administered 2015-11-11: 3.375 g via INTRAVENOUS
  Filled 2015-11-11: qty 50

## 2015-11-11 MED ORDER — SODIUM CHLORIDE 0.9 % IV BOLUS (SEPSIS)
1000.0000 mL | Freq: Once | INTRAVENOUS | Status: AC
  Administered 2015-11-11: 1000 mL via INTRAVENOUS

## 2015-11-11 MED ORDER — VANCOMYCIN HCL IN DEXTROSE 1-5 GM/200ML-% IV SOLN
1000.0000 mg | Freq: Once | INTRAVENOUS | Status: AC
  Administered 2015-11-11: 1000 mg via INTRAVENOUS
  Filled 2015-11-11: qty 200

## 2015-11-11 MED ORDER — ENOXAPARIN SODIUM 40 MG/0.4ML ~~LOC~~ SOLN
40.0000 mg | SUBCUTANEOUS | Status: DC
  Administered 2015-11-11: 40 mg via SUBCUTANEOUS
  Filled 2015-11-11: qty 0.4

## 2015-11-11 MED ORDER — ACETAMINOPHEN 650 MG RE SUPP: 650.0000 mg | Freq: Four times a day (QID) | RECTAL | Status: DC | PRN

## 2015-11-11 MED ORDER — DONEPEZIL HCL 5 MG PO TABS
10.0000 mg | ORAL_TABLET | Freq: Every day | ORAL | Status: DC
  Administered 2015-11-11: 10 mg via ORAL
  Filled 2015-11-11 (×2): qty 2

## 2015-11-11 MED ORDER — PIPERACILLIN-TAZOBACTAM 3.375 G IVPB: 3.3750 g | Freq: Three times a day (TID) | INTRAVENOUS | Status: DC

## 2015-11-11 MED ORDER — POLYETHYLENE GLYCOL 3350 17 G PO PACK: 17.0000 g | PACK | Freq: Every day | ORAL | Status: DC | PRN

## 2015-11-11 MED ORDER — ACETAMINOPHEN 325 MG PO TABS: 650.0000 mg | ORAL_TABLET | Freq: Four times a day (QID) | ORAL | Status: DC | PRN

## 2015-11-11 MED ORDER — MEMANTINE HCL 10 MG PO TABS
10.0000 mg | ORAL_TABLET | Freq: Two times a day (BID) | ORAL | Status: DC
  Administered 2015-11-11 – 2015-11-12 (×2): 10 mg via ORAL
  Filled 2015-11-11 (×2): qty 1

## 2015-11-11 MED ORDER — ENSURE ENLIVE PO LIQD
237.0000 mL | Freq: Two times a day (BID) | ORAL | Status: DC
  Administered 2015-11-11 – 2015-11-12 (×2): 237 mL via ORAL

## 2015-11-11 MED ORDER — TRAZODONE HCL 50 MG PO TABS
50.0000 mg | ORAL_TABLET | Freq: Every day | ORAL | Status: DC
  Administered 2015-11-11: 50 mg via ORAL
  Filled 2015-11-11: qty 1

## 2015-11-11 MED ORDER — ASPIRIN EC 81 MG PO TBEC
81.0000 mg | DELAYED_RELEASE_TABLET | Freq: Every day | ORAL | Status: DC
Start: 2015-11-12 — End: 2015-11-12
  Administered 2015-11-12: 81 mg via ORAL
  Filled 2015-11-11: qty 1

## 2015-11-11 MED FILL — Medication: Qty: 1 | Status: AC

## 2015-11-11 NOTE — ED Notes (Signed)
Tried to call report to [facility].  Received a call that the patient had been moved from [facility] to [facility]

## 2015-11-11 NOTE — Code Documentation (Signed)
CODE BLUE NOTE  Patient Name: William Walters   MRN: [id number]   Date of Birth/ Sex: 04-22-1933 , male      Admission Date: 11/11/2015  Attending Provider: Lacretia Leigh, MD  Primary Diagnosis: <principal problem not specified>    Indication: Pt was in his usual state of health until this AM, when he was noted to be hypotensive in pre-admitting area and lethargic. Code blue was subsequently called. At the time of arrival on scene, he was placed on bag-valve mask and receiving IVF.    Technical Description:  - CPR performance duration:    N/A  - Was defibrillation or cardioversion used? No   - Was external pacer placed? No  - Was patient intubated pre/post CPR? No    Post CPR evaluation:  - Final Status - Was patient successfully resuscitated ? Yes - What is current rhythm? NSR - What is current hemodynamic status? Stable   Miscellaneous Information:  - Labs sent, including: CBG 121  - Primary team notified?  No  - Family Notified? Yes  - Additional notes/ transfer status:  Patient was stabilized and sent to ED     Code was attended by IM resident Jacques Earthly, M.D.   Bufford Lope, DO  11/11/2015, 12:20 PM

## 2015-11-11 NOTE — ED Notes (Signed)
Phlebotomy at the bedside  

## 2015-11-11 NOTE — ED Notes (Signed)
Phlebotomy unable to get blood on the first try. Phlebotomy to try again.

## 2015-11-11 NOTE — ED Notes (Signed)
Catheryn Bacon given

## 2015-11-11 NOTE — ED Notes (Signed)
Per bed placement pt. Now waiting for a medical bed and no longer going to 3S

## 2015-11-11 NOTE — ED Notes (Signed)
Pt CBG, 86.

## 2015-11-11 NOTE — ED Notes (Signed)
Tried to call report to [facility]

## 2015-11-11 NOTE — ED Notes (Signed)
Report called and given

## 2015-11-11 NOTE — ED Provider Notes (Signed)
I saw and evaluated the patient, reviewed the resident's note and I agree with the findings and plan.   EKG Interpretation None     Patient with weakness and hypotension that was found today while he was being evaluated for surgery. He denies any recent illnesses at this time. His alert and oriented 4 at this time. Will check labs and blood work and reassess  Lacretia Leigh, MD 11/11/15 1059

## 2015-11-11 NOTE — Progress Notes (Signed)
William Walters [id number] Admitted to 8A41: 11/11/2015 5:57 PM Attending Provider: Oval Linsey, MD    William Walters is a 82 y.o. male patient admitted from ED awake, alert  & orientated  X 3,  DNR, VSS - Blood pressure 145/69, pulse 98, temperature 99.1 F (37.3 C), temperature source Oral, resp. rate 18, weight 58.968 kg (130 lb), SpO2 100 %., R/A, no c/o shortness of breath, no c/o chest pain, no distress noted. Tele # [id number] placed and pt is currently running:normal sinus rhythm.   IV site WDL:  forearm left, condition patent and no redness with a transparent dsg that's clean dry and intact.  Allergies:  No Known Allergies   Past Medical History  Diagnosis Date  . Hypertension   . Stroke ([place])   . Alzheimer disease   . Cancer ([place])     Lung  . Headache   . Moderate protein-calorie malnutrition ([place])     History:  obtained from the patient.  Pt orientation to unit, room and routine. Information packet given to patient.  Admission INP armband ID verified with patient, and in place. SR up x 2, fall risk assessment complete with Patient verbalizing understanding of risks associated with falls, yellow armband and socks placed, along with DNR bracelet. Pt verbalizes an understanding of how to use the call bell and to call for help before getting out of bed.  Skin, clean-dry- intact with unstageable to right hip and gangrene 2nd toe on left foot.evidence.       Will cont to monitor and assist as needed.  Lindalou Hose, RN 11/11/2015 5:57 PM

## 2015-11-11 NOTE — Progress Notes (Signed)
Pt here for his PAT appointment. Tech checking vs bp low 57/34 rechecked manual 54/24 sat 85 on Ra  instructed pt and caregiver he will need to go to ED. Levada Dy FNP called and informed states to place him  On a stretcher and take to ED. Stretcher  obtained, pt become unresponsive- code blue initiated.

## 2015-11-11 NOTE — H&P (Signed)
Date: 11/11/2015               Patient Name:  William Walters MRN: [id number]  DOB: 09/24/1932 Age / Sex: 82 y.o., male   PCP: Janifer Adie, MD         Medical Service: Internal Medicine Teaching Service         Attending Physician: Dr. Oval Linsey, MD    First Contact: Dr. Velna Ochs Pager: [phone]  Second Contact: Dr. Jacques Earthly  Pager: [phone]       After Hours (After 5p/  First Contact Pager: [phone]  weekends / holidays): Second Contact Pager: [phone]   Chief Complaint: Hypotension   History of Present Illness: William Walters is a 82 yo M with a pmhx of vascular dementia, HTN, PVD with limb ischemia presenting today after an episode of hypotension. Patient was here today for L leg amputation when he was found to be hypotensive in pre-op. Code blue was called because patient became unresponsive. He quickly regained consciousness, maintained a pulse, and did not require resuscitation. In the ED history was limited due to dementia. However patient stated that he felt fine. However labs were significant for a leukocytosis of 19 and a lactate of 3. Patient was afebrile and BP normalized after fluids.   Meds: Current Facility-Administered Medications  Medication Dose Route Frequency Provider Last Rate Last Dose  . acetaminophen (TYLENOL) tablet 650 mg  650 mg Oral Q6H PRN Milagros Loll, MD       Or  . acetaminophen (TYLENOL) suppository 650 mg  650 mg Rectal Q6H PRN Milagros Loll, MD      . Derrill Memo ON 11/12/2015] aspirin EC tablet 81 mg  81 mg Oral Daily Milagros Loll, MD      . donepezil (ARICEPT) tablet 10 mg  10 mg Oral QHS Milagros Loll, MD      . enoxaparin (LOVENOX) injection 40 mg  40 mg Subcutaneous Q24H Milagros Loll, MD      . feeding supplement (ENSURE ENLIVE) (ENSURE ENLIVE) liquid 237 mL  237 mL Oral BID BM Milagros Loll, MD      . memantine [facility]) tablet 10 mg  10 mg Oral BID Milagros Loll, MD      . polyethylene glycol (MIRALAX /  GLYCOLAX) packet 17 g  17 g Oral Daily PRN Milagros Loll, MD      . traZODone (DESYREL) tablet 50 mg  50 mg Oral QHS Milagros Loll, MD        Allergies: Allergies as of 11/11/2015  . (No Known Allergies)   Past Medical History  Diagnosis Date  . Hypertension   . Stroke ([place])   . Alzheimer disease   . Cancer ([place])     Lung  . Headache   . Moderate protein-calorie malnutrition ([place])     Family History: Unable to obtain   Social History: Unable to obtain   Review of Systems: A complete ROS was negative except as per HPI. Unable to obtain due to dementia   Physical Exam: Blood pressure 145/69, pulse 98, temperature 99.1 F (37.3 C), temperature source Oral, resp. rate 18, weight 130 lb (58.968 kg), SpO2 100 %.   Constitutional: Cachetic elderly gentleman in NAD, eating ice cream  HEENT: Atraumatic, normocephalic, anicteric sclera  Cardiovascular: RRR, no murmurs, rubs or gallops  Respiratory: CTAB  However poor inspiratory effort  Gastrointestinal: Soft non tender non distended Extremities: No edema, R leg  amputation to the knee, gangrenous toe on L foot  Integumentary: No rashes or lesions  Neurological: CN II - XII grossly intact  Psychiatric: +dementia   EKG: Normal sinus rhythm, largely unchanged from prior 06/09/2015  CXR:  FINDINGS: left apical soft tissue density and suprahilar density again noted with retraction of the left hilum superiorly. This is stable since prior study. No confluent opacity on the right. Heart is normal size. No effusions or acute bony abnormality.  IMPRESSION: Stable left apical and suprahilar soft tissue density and retraction of the left hilum superiorly. This presumably represents scarring although apical mass cannot be excluded. Consider chest CT for further evaluation.  No active disease.  Assessment & Plan by Problem:   Hyposention: BP normalized after 2L fluid resuscitation, currently stable. Etiology unclear. Possibly  vasovagal vs. Dehydration vs. Sepsis with leukocytosis of 19 and lactate of 3. No other SIRS/Sepsis criteria met.  -  Received one dose of IV Vanc and Zosyn in ED - Blood cultures pending  - Continue to monitor   Vascular Dementia:  - Continue memantine and donepezil  - ASA 81 daily   HTN: - Holding home Lisinopril   Lung Mass:  - Being treated conservatively with radiation and monitoring.  Insomnia: - Trazodone QHS  Dispo: Admit patient to Observation with expected length of stay less than 2 midnights.  Signed: Velna Ochs, MD 11/11/2015, 6:25 PM  Pager: [phone]

## 2015-11-11 NOTE — ED Notes (Signed)
Pt returned from X-ray.  

## 2015-11-11 NOTE — ED Notes (Signed)
Attempted to call report

## 2015-11-11 NOTE — ED Provider Notes (Signed)
CSN: [id number]     Arrival date & time 11/11/15  1045 History   First MD Initiated Contact with Patient 11/11/15 1046     Chief Complaint  Patient presents with  . Hypotension     (Consider location/radiation/quality/duration/timing/severity/associated sxs/prior Treatment) HPI Patient is an 82 year old male with past history of stroke, Alzheimer's, Lung cancer, and right above-the-knee amputation presenting today for hypotension and difficulty arousing while at the preoperative clinic for a left lower extremity amputation. IN the patient's blood pressure he was difficult to arouse to verbal and tactile stimulation and was found to have a blood pressure of 50s over 20s confirmed by manual blood pressure. He was brought emergently to the emergency department for further evaluation. Upon arrival he has no complaints and denies any chest pain, headache, difficulty breathing, weakness, or numbness. Past Medical History  Diagnosis Date  . Hypertension   . Stroke ([place])   . Alzheimer disease   . Cancer ([place])     Lung  . Headache   . Moderate protein-calorie malnutrition [facility])    Past Surgical History  Procedure Laterality Date  . Fracture surgery    . Amputation Right 06/09/2015    Procedure: AMPUTATION ABOVE KNEE;  Surgeon: Angelia Mould, MD;  Location: [place];  Service: Vascular;  Laterality: Right;  . Peripheral vascular catheterization N/A 09/21/2015    Procedure: Lower Extremity Angiography - Left Leg;  Surgeon: Serafina Mitchell, MD;  Location: [place] [facility];  Service: Cardiovascular;  Laterality: N/A;   No family history on file. Social History  Substance Use Topics  . Smoking status: Former Smoker    Types: Cigarettes  . Smokeless tobacco: Never Used  . Alcohol Use: No    Review of Systems  Constitutional: Negative for fever, chills, diaphoresis and fatigue.  HENT: Negative for congestion.   Eyes: Negative for visual disturbance.  Respiratory: Negative for cough,  chest tightness and shortness of breath.   Cardiovascular: Negative for chest pain.  Gastrointestinal: Negative for nausea and abdominal pain.  Genitourinary: Negative for dysuria and flank pain.  Musculoskeletal: Negative for neck pain.  Skin: Negative for rash.  Neurological: Negative for weakness, numbness and headaches.  Psychiatric/Behavioral: Negative for confusion and agitation.      Allergies  Review of patient's allergies indicates no known allergies.  Home Medications   Prior to Admission medications   Medication Sig Start Date End Date Taking? Authorizing Provider  aspirin 81 MG tablet Take 81 mg by mouth daily. Reported on 09/15/2015    Historical Provider, MD  Cholecalciferol (VITAMIN D-3 PO) Take 50,000 Units by mouth every 30 (thirty) days. Reported on 09/15/2015    Historical Provider, MD  donepezil (ARICEPT) 10 MG tablet Take 10 mg by mouth at bedtime. Reported on 09/15/2015    Historical Provider, MD  ENSURE PLUS (ENSURE PLUS) LIQD Take 237 mLs by mouth 2 (two) times daily between meals.    Historical Provider, MD  lisinopril (PRINIVIL,ZESTRIL) 20 MG tablet Take 1 tablet (20 mg total) by mouth daily. 04/20/13   Candelaria Celeste, MD  memantine (NAMENDA) 10 MG tablet Take 10 mg by mouth 2 (two) times daily.    Historical Provider, MD  nystatin cream (MYCOSTATIN) Apply 1 application topically 3 (three) times daily as needed for dry skin.     Historical Provider, MD  polyethylene glycol (MIRALAX / GLYCOLAX) packet Take 17 g by mouth daily as needed for mild constipation.    Historical Provider, MD  Skin Protectants, Misc. (EUCERIN)  cream Apply 1 application topically 2 (two) times daily.    Historical Provider, MD  traZODone (DESYREL) 50 MG tablet Take 50 mg by mouth at bedtime.    Historical Provider, MD   BP 101/76 mmHg  Pulse 96  Temp(Src) 97.9 F (36.6 C) (Oral)  Resp 15  Wt 58.968 kg  SpO2 100% Physical Exam  Constitutional: He is oriented to person, place, and time.  No distress.  Cachectic  HENT:  Head: Normocephalic and atraumatic.  Eyes: Conjunctivae are normal.  Cardiovascular: Normal rate and normal heart sounds.   No murmur heard. Pulmonary/Chest: Effort normal and breath sounds normal. No respiratory distress. He has no wheezes.  Abdominal: Soft. There is no tenderness.  Musculoskeletal: He exhibits no edema.  Right above-the-knee amputation  Neurological: He is alert and oriented to person, place, and time.  Chronic right upper extremity contraction. No weakness or numbness of the left upper or lower extremity  Skin: Skin is warm. He is not diaphoretic.  Psychiatric: He has a normal mood and affect. His behavior is normal.  Nursing note and vitals reviewed.   ED Course  Procedures (including critical care time) Labs Review Labs Reviewed  BASIC METABOLIC PANEL  CBC  URINALYSIS, ROUTINE W REFLEX MICROSCOPIC (NOT AT [facility])  CBG MONITORING, ED    Imaging Review No results found. I have personally reviewed and evaluated these images and lab results as part of my medical decision-making.   EKG Interpretation None      MDM   Final diagnoses:  None   Patient's an 82 year old male who was in the preoperative clinic for scheduled left lower extremity amputation presenting today for hypotension and decreased responsiveness. He was noted to have blood pressure 50s over 20s in the clinic but on arrival to ED has a blood pressure 100s over 70s confirmed by manual pressure. He is mentating normally and is alert and oriented 3.  He was given fluid bolus and was able to tolerate by mouth intake. Laboratory evaluation did not demonstrate any evidence of sepsis or cardiac disease. CT of the head was not obtained as patient has no change in neurologic exam from baseline right-sided stroke deficits and no current headache or vision changes.  Patient found to have elevated lactate of 3 as well as white blood cell count of 19 on laboratory  evaluation. Chest x-ray did not show any evidence of pulmonary or cardiac abnormality. Blood cultures were obtained and patient was started on broad-spectrum antibiotics. Throughout his stay in the emergency department he did not have any further hypotensive episodes. He received a 1 L normal saline bolus and was able to tolerate by mouth intake.  Repeat lactate 2.7. He was given a second liter bolus of normal saline.  EKG shows ventricular rate of 82 bpm with no evident P waves. QRS complex narrow. No ST depression or elevation concerning for ischemia.  Patient was admitted to the telemetry unit for further evaluation of sepsis of an unknown source.   Allie Bossier, MD 11/11/15 [phone]

## 2015-11-11 NOTE — Progress Notes (Signed)
   11/11/15 1100  Clinical Encounter Type  Visited With Patient not available  Visit Type Code  Referral From Nurse  Consult/Referral To Chaplain  Stress Factors  Patient Stress Factors Loss of control  CHP responded to Code Blue. Assistance no needed. Roe Coombs 11/11/2015

## 2015-11-11 NOTE — ED Notes (Signed)
Hospitalist at the bedside 

## 2015-11-11 NOTE — ED Notes (Signed)
Tried to call report to 3S to [facility]

## 2015-11-11 NOTE — Progress Notes (Signed)
Pharmacy Antibiotic Note  William Walters is a 82 y.o. male admitted on 11/11/2015 with sepsis.  Pharmacy has been consulted for vancomycin and zosyn dosing. Pt is afebrile and WBC is elevated at 19.1. Scr is WNL. Lactic acid is 2.7.   Plan: - Vanc 1gm IV x 1 then '500mg'$  IV Q12H - Zosyn 3.375gm IV Q8H (4 hr inf) - F/u renal fxn, C&S, clinical status and trough at SS  Weight: 130 lb (58.968 kg)  Temp (24hrs), Avg:97.8 F (36.6 C), Min:97 F (36.1 C), Max:98.6 F (37 C)   Recent Labs Lab 11/11/15 1037 11/11/15 1115 11/11/15 1117  WBC  --   --  19.1*  CREATININE 0.90  --  0.91  LATICACIDVEN  --  3.04* 2.7*    Estimated Creatinine Clearance: 53.1 mL/min (by C-G formula based on Cr of 0.91).    No Known Allergies  Antimicrobials this admission: Vanc 7/14>> Zosyn 7/14>>  Dose adjustments this admission: N/A  Microbiology results: Pending  Thank you for allowing pharmacy to be a part of this patient's care.  Jarett Dralle, Rande Lawman 11/11/2015 1:16 PM

## 2015-11-11 NOTE — ED Notes (Signed)
Paged Admitting about patient's BP

## 2015-11-11 NOTE — ED Notes (Signed)
Ordered Reg diet meal tray

## 2015-11-11 NOTE — ED Notes (Signed)
Per Pre-Op RN, Pt was coming from [facility] to have left leg amputation. Pt was been triaged when he started to become less responsive. Vitals reported to be 57/34 Automatic, 54/24 Manual, 85% on RA, 90 HR. Code called. Rapid Response responded. Pt reported never to lose consciousness or pulses. EKG was poor quality, but showed Accelerated Junctional Rhythm. Pt DNR without paperwork in the facility.

## 2015-11-11 NOTE — Progress Notes (Signed)
   Daily Progress Note  L foot dry gangrenous changes unchanged.  Doubt this is the source of his possible sepsis.  - pt remains on schedule for L AKA on Tues   [name], MD [facility] Office: [phone] Pager: [phone]  11/11/2015, 4:32 PM

## 2015-11-11 NOTE — ED Notes (Addendum)
Pt eating meal per MD order. No problems.

## 2015-11-12 ENCOUNTER — Other Ambulatory Visit: Payer: Self-pay

## 2015-11-12 DIAGNOSIS — A419 Sepsis, unspecified organism: Secondary | ICD-10-CM | POA: Insufficient documentation

## 2015-11-12 DIAGNOSIS — B9689 Other specified bacterial agents as the cause of diseases classified elsewhere: Secondary | ICD-10-CM

## 2015-11-12 DIAGNOSIS — N3000 Acute cystitis without hematuria: Secondary | ICD-10-CM | POA: Insufficient documentation

## 2015-11-12 DIAGNOSIS — N390 Urinary tract infection, site not specified: Secondary | ICD-10-CM | POA: Diagnosis not present

## 2015-11-12 DIAGNOSIS — L8990 Pressure ulcer of unspecified site, unspecified stage: Secondary | ICD-10-CM | POA: Insufficient documentation

## 2015-11-12 LAB — URINALYSIS, ROUTINE W REFLEX MICROSCOPIC
BILIRUBIN URINE: NEGATIVE
GLUCOSE, UA: NEGATIVE mg/dL
Ketones, ur: NEGATIVE mg/dL
Nitrite: POSITIVE — AB
Protein, ur: NEGATIVE mg/dL
SPECIFIC GRAVITY, URINE: 1.018 (ref 1.005–1.030)
pH: 7.5 (ref 5.0–8.0)

## 2015-11-12 LAB — CBC
HCT: 35.6 % — ABNORMAL LOW (ref 39.0–52.0)
Hemoglobin: 11.5 g/dL — ABNORMAL LOW (ref 13.0–17.0)
MCH: 29 pg (ref 26.0–34.0)
MCHC: 32.3 g/dL (ref 30.0–36.0)
MCV: 89.9 fL (ref 78.0–100.0)
PLATELETS: 223 10*3/uL (ref 150–400)
RBC: 3.96 MIL/uL — ABNORMAL LOW (ref 4.22–5.81)
RDW: 15.6 % — AB (ref 11.5–15.5)
WBC: 11.8 10*3/uL — ABNORMAL HIGH (ref 4.0–10.5)

## 2015-11-12 LAB — URINE MICROSCOPIC-ADD ON

## 2015-11-12 MED ORDER — CIPROFLOXACIN HCL 250 MG PO TABS: 250.0000 mg | ORAL_TABLET | Freq: Two times a day (BID) | ORAL | Status: AC

## 2015-11-12 MED ORDER — DEXTROSE 5 % IV SOLN
2.0000 g | INTRAVENOUS | Status: DC
  Administered 2015-11-12: 2 g via INTRAVENOUS
  Filled 2015-11-12: qty 2

## 2015-11-12 MED ORDER — CIPROFLOXACIN HCL 500 MG PO TABS: 250.0000 mg | ORAL_TABLET | Freq: Two times a day (BID) | ORAL | Status: DC

## 2015-11-12 NOTE — Clinical Social Work Note (Signed)
[facility] has arranged care at [facility] prior to patient being admitted into hospital. [facility] RN has confirmed patient's admission into [facility] today, 7/15. Facility does not require FL-2. CSW will send d/c summary and arrange transportation via EMS once completed.   MD will need to sign DNR.   Clinical Social Worker facilitated patient discharge including contacting patient family and facility to confirm patient discharge plans.  Clinical information faxed to facility and family agreeable with plan.  CSW arranged ambulance transport via EMS to [place].  RN to call report prior to discharge [phone].  Clinical Social Worker will sign off for now as social work intervention is no longer needed. Please consult [place] again if new need arises.  Glendon Axe, MSW, [facility] 11/12/2015 12:24 PM

## 2015-11-12 NOTE — Progress Notes (Signed)
Report called and given to Mikael Spray, RN at [facility]. Pt is awaiting transportation to facility.

## 2015-11-12 NOTE — H&P (Signed)
Internal Medicine Attending Admission Note Date: 11/12/2015  Patient name: William Walters Medical record number: [id number] Date of birth: [birth year] Age: 82 y.o. Gender: male  I saw and evaluated the patient. I reviewed the resident's note and I agree with the resident's findings and plan as documented in the resident's note.  Chief Complaint(s): Hypotension  History - key components related to admission:  William Walters is an 82 year old man with a history of vascular dementia, peripheral vascular occlusive disease with limb ischemia who is preoperative for a left AKA, and hypertension who was seen in the preop clinic today where he became hypotensive and poorly responsive. A CODE BLUE was called but he never lost consciousness, his pulse, or blood pressure. He was transported to the emergency department where he was resuscitated with normal saline. A lactate was elevated and he was given broad-spectrum antibiotics. He was admitted to the internal medicine teaching service for further evaluation and care.  He subsequently was discovered to have a urinary tract infection and his antibiotics were narrowed. When seen on rounds the morning after admission he was feeling fine without any complaints.  Physical Exam - key components related to admission:  Filed Vitals:   11/11/15 1754 11/11/15 2117 11/12/15 0508 11/12/15 1252  BP: 145/69 146/58 142/83 98/65  Pulse: 98 91 83 79  Temp: 99.1 F (37.3 C) 98.7 F (37.1 C) 98.6 F (37 C) 98.2 F (36.8 C)  TempSrc: Oral Oral Oral   Resp: '18 19 18 18  '$ Weight:      SpO2:  95% 97% 98%   Gen.: Well-developed, thin, man lying comfortably in bed in no acute distress. Lungs: Clear to auscultation bilaterally anteriorly. Heart: Regular rate and rhythm without murmurs, rubs, or gallops. Abdomen: Soft, nontender, active bowel sounds. Extremities dry gangrene of the toe on the left lower extremity.  Lab results:  Basic Metabolic Panel:  Recent Labs  11/11/15 1037 11/11/15 1117  NA 141 140  K 4.1 4.2  CL 104 105  CO2  --  28  GLUCOSE 140* 114*  BUN 22* 19  CREATININE 0.90 0.91  CALCIUM  --  9.3   CBC:  Recent Labs  11/11/15 1117 11/12/15 0545  WBC 19.1* 11.8*  HGB 13.1 11.5*  HCT 39.8 35.6*  MCV 90.0 89.9  PLT 224 223   CBG:  Recent Labs  11/11/15 1033 11/11/15 1120  GLUCAP 130* 86   Urinalysis:  Cloudy, yellow, specific gravity 1.018, pH 7.5, hemoglobin small, nitrite positive, leukocytes large, 6-30 white blood cells per high-power field, 6-30 red blood cells per high-power field, many bacteria.  Misc. Labs:  Lactic acid 3.04 -> 2.7 -> 2.76 Blood culture 2 pending Urine culture pending  Imaging results:  Dg Chest 2 View  11/11/2015  CLINICAL DATA:  Near syncope.  Preop leg amputation. EXAM: CHEST  2 VIEW COMPARISON:  01/28/2015, chest CT 12/18/2012 FINDINGS: left apical soft tissue density and suprahilar density again noted with retraction of the left hilum superiorly. This is stable since prior study. No confluent opacity on the right. Heart is normal size. No effusions or acute bony abnormality. IMPRESSION: Stable left apical and suprahilar soft tissue density and retraction of the left hilum superiorly. This presumably represents scarring although apical mass cannot be excluded. Consider chest CT for further evaluation. No active disease. Electronically Signed   By: Rolm Baptise M.D.   On: 11/11/2015 11:52   Other results:  EKG: Accelerated junctional rhythm at 82 bpm, normal intervals, normal axis, no  significant Q waves, no LVH by voltage, early R wave progression, no ST or T wave changes. No significant changes from the previous ECG on 06/09/2015.  Assessment & Plan by Problem:  William Walters is an 82 year old man with a history of vascular dementia, peripheral vascular occlusive disease with limb ischemia who is preoperative for a left AKA, and hypertension who was seen in the preop clinic today where he  became hypotensive and poorly responsive. He was found to be volume deplete and to have a concomitant urinary tract infection. It is felt that these were the cause of the hypotensive episode especially given his response to the IV fluid resuscitation and antibiotics.  1) Urinary tract infection: Urine cultures pending at this time. He is being discharged to the nursing home on ciprofloxacin for a three-day course.  2) Hypertension: This has resolved with volume resuscitation. He will be encouraged to maintain oral intake.  3) Disposition: He is being transferred to [facility] respite prior to his scheduled amputation of the left lower extremity on Tuesday. Further follow-up will be with his primary care provider at [place].

## 2015-11-12 NOTE — Discharge Summary (Signed)
Name: William Walters MRN: [id number] DOB: 06/23/1932 82 y.o. PCP: Janifer Adie, MD  Date of Admission: 11/11/2015 10:45 AM Date of Discharge: 11/12/2015 Attending Physician: Oval Linsey, MD  Discharge Diagnosis: 1. Uncomplicated UTI  Active Problems:   UTI (urinary tract infection)   Pressure ulcer   Sepsis ([place])   Acute cystitis without hematuria   Discharge Medications:   Medication List    TAKE these medications        aspirin 81 MG tablet  Take 81 mg by mouth daily. Reported on 09/15/2015     ciprofloxacin 250 MG tablet  Commonly known as:  CIPRO  Take 1 tablet (250 mg total) by mouth 2 (two) times daily.  Start taking on:  11/13/2015     donepezil 10 MG tablet  Commonly known as:  ARICEPT  Take 10 mg by mouth at bedtime. Reported on 09/15/2015     ENSURE PLUS Liqd  Take 237 mLs by mouth 2 (two) times daily between meals.     eucerin cream  Apply 1 application topically 2 (two) times daily.     lisinopril 20 MG tablet  Commonly known as:  PRINIVIL,ZESTRIL  Take 1 tablet (20 mg total) by mouth daily.     memantine 10 MG tablet  Commonly known as:  NAMENDA  Take 10 mg by mouth 2 (two) times daily.     nystatin cream  Commonly known as:  MYCOSTATIN  Apply 1 application topically 3 (three) times daily as needed for dry skin.     polyethylene glycol packet  Commonly known as:  MIRALAX / GLYCOLAX  Take 17 g by mouth daily as needed for mild constipation.     traZODone 50 MG tablet  Commonly known as:  DESYREL  Take 50 mg by mouth at bedtime.     VITAMIN D-3 PO  Take 50,000 Units by mouth every 30 (thirty) days. Reported on 09/15/2015        Disposition and follow-up:   [name] was discharged from [facility] in Stable condition.  At the hospital follow up visit please address:  1.  Please ensure patient finishes antibiotic course: Cipro '250mg'$  BID for 2 more days   2.  Labs / imaging needed at time of follow-up:  None  3.  Pending labs/ test needing follow-up: Blood Cx, Urine Cx  Follow-up Appointments:   Hospital Course by problem list: Active Problems:   UTI (urinary tract infection)   Pressure ulcer   Sepsis ([place])   Acute cystitis without hematuria   1. Uncomplicated UTI: [name] was in pre-op yesterday for his L AKA when he suddenly became hypotensive and unresponsive. Code blue was called. He quickly regained consciousness, maintained a pulse, and did not require resuscitation. In the ED history was limited due to dementia. However patient stated that he felt fine. His labs were significant for a leukocytosis of 19 and a lactate of 3. Patient was afebrile and BP normalized after 2L fluid resuscitation. CXR was negative for any acute processes. He was given one dose of IV Vanc and Zosyn in the ED and continued on IV ceftriaxone the following day. UA was positive for nitrites, urine Cx pending. Patient was at baseline mentation during his stay. Discharged with instructions to take Cipro 250 BID for two more days to complete a 3 day course.  Discharge Vitals:   BP 142/83 mmHg  Pulse 83  Temp(Src) 98.6 F (37 C) (Oral)  Resp 18  Wt  130 lb (58.968 kg)  SpO2 97%  Pertinent Labs, Studies, and Procedures:   7/14 CXR 2 View: IMPRESSION: Stable left apical and suprahilar soft tissue density and retraction of the left hilum superiorly. This presumably represents scarring although apical mass cannot be excluded. Consider chest CT for further evaluation.  No active disease.  Labs:    Ref. Range 11/11/2015 11:17  WBC Latest Ref Range: 4.0-10.5 K/uL 19.1 (H)  RBC Latest Ref Range: 4.22-5.81 MIL/uL 4.42  Hemoglobin Latest Ref Range: 13.0-17.0 g/dL 13.1  HCT Latest Ref Range: 39.0-52.0 % 39.8  MCV Latest Ref Range: 78.0-100.0 fL 90.0  MCH Latest Ref Range: 26.0-34.0 pg 29.6  MCHC Latest Ref Range: 30.0-36.0 g/dL 32.9  RDW Latest Ref Range: 11.5-15.5 % 15.2  Platelets Latest Ref Range:  150-400 K/uL 224    Ref. Range 11/11/2015 11:17  Lactic Acid, Venous Latest Ref Range: 0.5-1.9 mmol/L 2.7 (HH)    Ref. Range 11/11/2015 23:55  Appearance Latest Ref Range: CLEAR  CLOUDY (A)  Bacteria, UA Latest Ref Range: NONE SEEN  MANY (A)  Bilirubin Urine Latest Ref Range: NEGATIVE  NEGATIVE  Casts Latest Ref Range: NEGATIVE  HYALINE CASTS (A)  Color, Urine Latest Ref Range: YELLOW  YELLOW  Glucose Latest Ref Range: NEGATIVE mg/dL NEGATIVE  Hgb urine dipstick Latest Ref Range: NEGATIVE  SMALL (A)  Ketones, ur Latest Ref Range: NEGATIVE mg/dL NEGATIVE  Leukocytes, UA Latest Ref Range: NEGATIVE  LARGE (A)  Nitrite Latest Ref Range: NEGATIVE  POSITIVE (A)  pH Latest Ref Range: 5.0-8.0  7.5  Protein Latest Ref Range: NEGATIVE mg/dL NEGATIVE  RBC / HPF Latest Ref Range: 0-5 RBC/hpf 6-30  Specific Gravity, Urine Latest Ref Range: 1.005-1.030  1.018  Squamous Epithelial / LPF Latest Ref Range: NONE SEEN  0-5 (A)  WBC, UA Latest Ref Range: 0-5 WBC/hpf 6-30   Micro:  - 7/14 Blood Cx pending - 7/14 Urine Cx pending  Discharge Instructions: Discharge Instructions    Call MD for:  persistant dizziness or light-headedness    Complete by:  As directed      Call MD for:  persistant nausea and vomiting    Complete by:  As directed      Call MD for:  temperature >100.4    Complete by:  As directed      Diet - low sodium heart healthy    Complete by:  As directed      Discharge instructions    Complete by:  As directed   Please complete antibiotic course: Ciprofloxacin 250 mg BID for 2 days     Increase activity slowly    Complete by:  As directed            Signed: Velna Ochs, MD 11/12/2015, 12:23 PM   Pager: [phone]

## 2015-11-12 NOTE — Progress Notes (Signed)
   Subjective: Patient with some urinary incontinence this morning. Laying in bed with NAD. No events overnight.   Objective: Vital signs in last 24 hours: Filed Vitals:   11/11/15 1645 11/11/15 1754 11/11/15 2117 11/12/15 0508  BP: 122/53 145/69 146/58 142/83  Pulse: 93 98 91 83  Temp:  99.1 F (37.3 C) 98.7 F (37.1 C) 98.6 F (37 C)  TempSrc:  Oral Oral Oral  Resp: '14 18 19 18  '$ Weight:      SpO2: 100%  95% 97%   Labs:  Ref. Range 11/12/2015 05:45  WBC Latest Ref Range: 4.0-10.5 K/uL 11.8 (H)  RBC Latest Ref Range: 4.22-5.81 MIL/uL 3.96 (L)  Hemoglobin Latest Ref Range: 13.0-17.0 g/dL 11.5 (L)  HCT Latest Ref Range: 39.0-52.0 % 35.6 (L)  MCV Latest Ref Range: 78.0-100.0 fL 89.9  MCH Latest Ref Range: 26.0-34.0 pg 29.0  MCHC Latest Ref Range: 30.0-36.0 g/dL 32.3  RDW Latest Ref Range: 11.5-15.5 % 15.6 (H)  Platelets Latest Ref Range: 150-400 K/uL 223    Ref. Range 11/11/2015 23:55  Appearance Latest Ref Range: CLEAR  CLOUDY (A)  Bacteria, UA Latest Ref Range: NONE SEEN  MANY (A)  Bilirubin Urine Latest Ref Range: NEGATIVE  NEGATIVE  Casts Latest Ref Range: NEGATIVE  HYALINE CASTS (A)  Color, Urine Latest Ref Range: YELLOW  YELLOW  Glucose Latest Ref Range: NEGATIVE mg/dL NEGATIVE  Hgb urine dipstick Latest Ref Range: NEGATIVE  SMALL (A)  Ketones, ur Latest Ref Range: NEGATIVE mg/dL NEGATIVE  Leukocytes, UA Latest Ref Range: NEGATIVE  LARGE (A)  Nitrite Latest Ref Range: NEGATIVE  POSITIVE (A)  pH Latest Ref Range: 5.0-8.0  7.5  Protein Latest Ref Range: NEGATIVE mg/dL NEGATIVE  RBC / HPF Latest Ref Range: 0-5 RBC/hpf 6-30  Specific Gravity, Urine Latest Ref Range: 1.005-1.030  1.018  Squamous Epithelial / LPF Latest Ref Range: NONE SEEN  0-5 (A)  WBC, UA Latest Ref Range: 0-5 WBC/hpf 6-30   Micro: - Blood Cx pending - Urine Cx pending  Physical Exam Constitutional: Cachetic elderly gentleman in NAD, eating ice cream  HEENT: Atraumatic, normocephalic, anicteric  sclera  Cardiovascular: RRR, no murmurs, rubs or gallops  Respiratory: CTAB However poor inspiratory effort  Gastrointestinal: Soft non tender non distended Extremities: No edema, R leg amputation to the knee, gangrenous 2nd toe on L foot  Integumentary: No rashes or lesions  Neurological: CN II - XII grossly intact  Psychiatric: +dementia   Assessment/Plan: William Walters is an 82 yo [facility] patient with pmhx of vascular dementia, HTN, PVD with limb ischemia who presents after an episode of hypotension and found to have a UTI now on antibiotics.  Uncomplicated UTI: Presented after an episode of hypotension that normalized after 2L fluid resuscitation, currently stable. On admission, leukocytosis of 19 and lactate of 3. UA positive for nitrites and leukocytes.  - Received one dose of IV Vanc and Zosyn in ED - Received IV ceftriaxone today; Start Cipro '250mg'$  po BID tomorrow for 2 days to complete 3 day course - Urine Cx pending - Continue to monitor   Vascular Dementia:  - Continue memantine and donepezil  - ASA 81 daily   HTN: - Holding home Lisinopril   Lung Mass:  - Being treated conservatively with radiation and monitoring.  Insomnia: - Trazodone QHS  DNR/DNI  Dispo: Anticipated discharge in approximately 1-2 day(s).     Velna Ochs, MD 11/12/2015, 7:44 AM Pager: [phone]

## 2015-11-12 NOTE — Progress Notes (Signed)
NURSING PROGRESS NOTE  William Walters [id number] Discharge Data: 11/12/2015 2:53 PM Attending Provider: Oval Linsey, MD ELT:[id number] Hart Carwin, MD     Rosey Bath to be D/C'd [place] per MD order.  Discussed with the patient the After Visit Summary and all questions fully answered. All IV's discontinued with no bleeding noted. All belongings returned to patient for patient to take home.   Last Vital Signs:  Blood pressure 98/65, pulse 79, temperature 98.2 F (36.8 C), temperature source Oral, resp. rate 18, weight 58.968 kg (130 lb), SpO2 98 %.  Discharge Medication List   Medication List    TAKE these medications        aspirin 81 MG tablet  Take 81 mg by mouth daily. Reported on 09/15/2015     ciprofloxacin 250 MG tablet  Commonly known as:  CIPRO  Take 1 tablet (250 mg total) by mouth 2 (two) times daily.  Start taking on:  11/13/2015     donepezil 10 MG tablet  Commonly known as:  ARICEPT  Take 10 mg by mouth at bedtime. Reported on 09/15/2015     ENSURE PLUS Liqd  Take 237 mLs by mouth 2 (two) times daily between meals.     eucerin cream  Apply 1 application topically 2 (two) times daily.     lisinopril 20 MG tablet  Commonly known as:  PRINIVIL,ZESTRIL  Take 1 tablet (20 mg total) by mouth daily.     memantine 10 MG tablet  Commonly known as:  NAMENDA  Take 10 mg by mouth 2 (two) times daily.     nystatin cream  Commonly known as:  MYCOSTATIN  Apply 1 application topically 3 (three) times daily as needed for dry skin.     polyethylene glycol packet  Commonly known as:  MIRALAX / GLYCOLAX  Take 17 g by mouth daily as needed for mild constipation.     traZODone 50 MG tablet  Commonly known as:  DESYREL  Take 50 mg by mouth at bedtime.     VITAMIN D-3 PO  Take 50,000 Units by mouth every 30 (thirty) days. Reported on 09/15/2015

## 2015-11-13 LAB — URINE CULTURE
Culture: <10000 — AB
SPECIAL REQUESTS: NORMAL

## 2015-11-14 MED ORDER — SODIUM CHLORIDE 0.9 % IV SOLN
INTRAVENOUS | Status: DC
  Administered 2015-11-15: 07:00:00 via INTRAVENOUS

## 2015-11-14 NOTE — Progress Notes (Signed)
Several unsuccessful attempts have been made to contact pt; no voice mailbox available. Spoke with Marliss Coots, RN, (On-Call for [facility]) and she stated that she as well as Colletta [place], [employer], made daughter Seth Bake aware to have pt arrive at 5:30 A.M.tomorrow. Daughter verbalized understanding.

## 2015-11-14 NOTE — Progress Notes (Signed)
Please complete pt pre-op assessment on DOS. Unable to contact pt and daughter.

## 2015-11-15 ENCOUNTER — Inpatient Hospital Stay (HOSPITAL_COMMUNITY): Payer: Medicare (Managed Care) | Admitting: Emergency Medicine

## 2015-11-15 ENCOUNTER — Encounter (HOSPITAL_COMMUNITY): Payer: Self-pay | Admitting: Anesthesiology

## 2015-11-15 ENCOUNTER — Inpatient Hospital Stay (HOSPITAL_COMMUNITY): Payer: Medicare (Managed Care) | Admitting: Anesthesiology

## 2015-11-15 ENCOUNTER — Encounter (HOSPITAL_COMMUNITY)
Admission: RE | Disposition: A | Discharge status: Skilled Nursing Facility | Payer: Self-pay | Source: Ambulatory Visit | Attending: Vascular Surgery

## 2015-11-15 ENCOUNTER — Inpatient Hospital Stay (HOSPITAL_COMMUNITY)
Admission: RE | Admit: 2015-11-15 | Discharge: 2015-11-16 | DRG: 240 | Disposition: A | Discharge status: Skilled Nursing Facility | Payer: Medicare (Managed Care) | Source: Ambulatory Visit | Attending: Vascular Surgery | Admitting: Vascular Surgery

## 2015-11-15 DIAGNOSIS — Z8673 Personal history of transient ischemic attack (TIA), and cerebral infarction without residual deficits: Secondary | ICD-10-CM | POA: Diagnosis not present

## 2015-11-15 DIAGNOSIS — F0151 Vascular dementia with behavioral disturbance: Secondary | ICD-10-CM | POA: Diagnosis present

## 2015-11-15 DIAGNOSIS — C3490 Malignant neoplasm of unspecified part of unspecified bronchus or lung: Secondary | ICD-10-CM | POA: Diagnosis present

## 2015-11-15 DIAGNOSIS — I9581 Postprocedural hypotension: Secondary | ICD-10-CM | POA: Diagnosis not present

## 2015-11-15 DIAGNOSIS — N390 Urinary tract infection, site not specified: Secondary | ICD-10-CM | POA: Diagnosis present

## 2015-11-15 DIAGNOSIS — Z89611 Acquired absence of right leg above knee: Secondary | ICD-10-CM | POA: Diagnosis not present

## 2015-11-15 DIAGNOSIS — G4700 Insomnia, unspecified: Secondary | ICD-10-CM | POA: Diagnosis present

## 2015-11-15 DIAGNOSIS — G300 Alzheimer's disease with early onset: Secondary | ICD-10-CM | POA: Diagnosis present

## 2015-11-15 DIAGNOSIS — Z79899 Other long term (current) drug therapy: Secondary | ICD-10-CM | POA: Diagnosis not present

## 2015-11-15 DIAGNOSIS — I10 Essential (primary) hypertension: Secondary | ICD-10-CM | POA: Diagnosis present

## 2015-11-15 DIAGNOSIS — Z66 Do not resuscitate: Secondary | ICD-10-CM | POA: Diagnosis present

## 2015-11-15 DIAGNOSIS — K5900 Constipation, unspecified: Secondary | ICD-10-CM | POA: Diagnosis present

## 2015-11-15 DIAGNOSIS — R32 Unspecified urinary incontinence: Secondary | ICD-10-CM | POA: Diagnosis present

## 2015-11-15 DIAGNOSIS — Z85118 Personal history of other malignant neoplasm of bronchus and lung: Secondary | ICD-10-CM

## 2015-11-15 DIAGNOSIS — R918 Other nonspecific abnormal finding of lung field: Secondary | ICD-10-CM | POA: Diagnosis present

## 2015-11-15 DIAGNOSIS — I70262 Atherosclerosis of native arteries of extremities with gangrene, left leg: Principal | ICD-10-CM | POA: Diagnosis present

## 2015-11-15 DIAGNOSIS — Z7982 Long term (current) use of aspirin: Secondary | ICD-10-CM

## 2015-11-15 DIAGNOSIS — I70269 Atherosclerosis of native arteries of extremities with gangrene, unspecified extremity: Secondary | ICD-10-CM | POA: Diagnosis present

## 2015-11-15 DIAGNOSIS — Z89612 Acquired absence of left leg above knee: Secondary | ICD-10-CM

## 2015-11-15 DIAGNOSIS — F0150 Vascular dementia without behavioral disturbance: Secondary | ICD-10-CM | POA: Diagnosis present

## 2015-11-15 HISTORY — PX: AMPUTATION: SHX166

## 2015-11-15 LAB — BASIC METABOLIC PANEL
ANION GAP: 6 (ref 5–15)
BUN: 15 mg/dL (ref 6–20)
CALCIUM: 8.9 mg/dL (ref 8.9–10.3)
CHLORIDE: 107 mmol/L (ref 101–111)
CO2: 26 mmol/L (ref 22–32)
CREATININE: 0.93 mg/dL (ref 0.61–1.24)
GFR calc non Af Amer: >60 mL/min (ref >60)
Glucose, Bld: 111 mg/dL — ABNORMAL HIGH (ref 65–99)
Potassium: 3.8 mmol/L (ref 3.5–5.1)
SODIUM: 139 mmol/L (ref 135–145)

## 2015-11-15 LAB — CBC
HCT: 33.7 % — ABNORMAL LOW (ref 39.0–52.0)
HEMOGLOBIN: 10.8 g/dL — AB (ref 13.0–17.0)
MCH: 29.3 pg (ref 26.0–34.0)
MCHC: 32 g/dL (ref 30.0–36.0)
MCV: 91.6 fL (ref 78.0–100.0)
PLATELETS: 229 10*3/uL (ref 150–400)
RBC: 3.68 MIL/uL — AB (ref 4.22–5.81)
RDW: 16 % — ABNORMAL HIGH (ref 11.5–15.5)
WBC: 10.3 10*3/uL (ref 4.0–10.5)

## 2015-11-15 LAB — GLUCOSE, CAPILLARY: GLUCOSE-CAPILLARY: 115 mg/dL — AB (ref 65–99)

## 2015-11-15 LAB — SURGICAL PCR SCREEN
MRSA, PCR: NEGATIVE
Staphylococcus aureus: NEGATIVE

## 2015-11-15 SURGERY — AMPUTATION, ABOVE KNEE
Anesthesia: General | Site: Leg Upper | Laterality: Left

## 2015-11-15 MED ORDER — DEXTROSE 5 % IV SOLN
1.5000 g | Freq: Two times a day (BID) | INTRAVENOUS | Status: AC
  Administered 2015-11-15 (×2): 1.5 g via INTRAVENOUS
  Filled 2015-11-15 (×2): qty 1.5

## 2015-11-15 MED ORDER — ONDANSETRON HCL 4 MG/2ML IJ SOLN: 4.0000 mg | Freq: Four times a day (QID) | INTRAMUSCULAR | Status: DC | PRN

## 2015-11-15 MED ORDER — ALBUMIN HUMAN 5 % IV SOLN
12.5000 g | Freq: Once | INTRAVENOUS | Status: AC
  Administered 2015-11-15: 12.5 g via INTRAVENOUS

## 2015-11-15 MED ORDER — MORPHINE SULFATE (PF) 2 MG/ML IV SOLN
2.0000 mg | INTRAVENOUS | Status: DC | PRN
  Administered 2015-11-15 (×2): 4 mg via INTRAVENOUS
  Administered 2015-11-15 – 2015-11-16 (×2): 2 mg via INTRAVENOUS
  Filled 2015-11-15 (×2): qty 1
  Filled 2015-11-15 (×2): qty 2

## 2015-11-15 MED ORDER — GUAIFENESIN-DM 100-10 MG/5ML PO SYRP
15.0000 mL | ORAL_SOLUTION | ORAL | Status: DC | PRN
Start: 2015-11-15 — End: 2015-11-16

## 2015-11-15 MED ORDER — ROCURONIUM BROMIDE 100 MG/10ML IV SOLN
INTRAVENOUS | Status: DC | PRN
  Administered 2015-11-15: 40 mg via INTRAVENOUS

## 2015-11-15 MED ORDER — POTASSIUM CHLORIDE CRYS ER 20 MEQ PO TBCR: 20.0000 meq | EXTENDED_RELEASE_TABLET | Freq: Every day | ORAL | Status: DC | PRN

## 2015-11-15 MED ORDER — FENTANYL CITRATE (PF) 250 MCG/5ML IJ SOLN
INTRAMUSCULAR | Status: AC
  Filled 2015-11-15: qty 5

## 2015-11-15 MED ORDER — ENOXAPARIN SODIUM 40 MG/0.4ML ~~LOC~~ SOLN
40.0000 mg | SUBCUTANEOUS | Status: DC
  Administered 2015-11-16: 40 mg via SUBCUTANEOUS
  Filled 2015-11-15: qty 0.4

## 2015-11-15 MED ORDER — LABETALOL HCL 5 MG/ML IV SOLN: 10.0000 mg | INTRAVENOUS | Status: DC | PRN

## 2015-11-15 MED ORDER — PHENYLEPHRINE HCL 10 MG/ML IJ SOLN
INTRAMUSCULAR | Status: DC | PRN
  Administered 2015-11-15 (×2): 80 ug via INTRAVENOUS

## 2015-11-15 MED ORDER — SUGAMMADEX SODIUM 200 MG/2ML IV SOLN
INTRAVENOUS | Status: DC | PRN
  Administered 2015-11-15: 118 mg via INTRAVENOUS

## 2015-11-15 MED ORDER — ALUM & MAG HYDROXIDE-SIMETH 200-200-20 MG/5ML PO SUSP: 15.0000 mL | ORAL | Status: DC | PRN

## 2015-11-15 MED ORDER — MUPIROCIN 2 % EX OINT: 1.0000 "application " | TOPICAL_OINTMENT | Freq: Once | CUTANEOUS | Status: DC

## 2015-11-15 MED ORDER — VITAMIN D-3 25 MCG (1000 UT) PO CAPS: 50000.0000 [IU] | ORAL_CAPSULE | ORAL | Status: DC

## 2015-11-15 MED ORDER — MAGNESIUM SULFATE 2 GM/50ML IV SOLN
2.0000 g | Freq: Every day | INTRAVENOUS | Status: DC | PRN
  Filled 2015-11-15: qty 50

## 2015-11-15 MED ORDER — VITAMIN D (ERGOCALCIFEROL) 1.25 MG (50000 UNIT) PO CAPS: 50000.0000 [IU] | ORAL_CAPSULE | ORAL | Status: DC

## 2015-11-15 MED ORDER — OXYCODONE HCL 5 MG/5ML PO SOLN: 5.0000 mg | Freq: Once | ORAL | Status: DC | PRN

## 2015-11-15 MED ORDER — ONDANSETRON HCL 4 MG/2ML IJ SOLN
INTRAMUSCULAR | Status: DC | PRN
  Administered 2015-11-15: 4 mg via INTRAVENOUS

## 2015-11-15 MED ORDER — OXYCODONE-ACETAMINOPHEN 5-325 MG PO TABS
1.0000 | ORAL_TABLET | Freq: Four times a day (QID) | ORAL | Status: DC | PRN
  Administered 2015-11-16: 2 via ORAL
  Filled 2015-11-15: qty 2

## 2015-11-15 MED ORDER — SODIUM CHLORIDE 0.9 % IV SOLN
10.0000 mg | INTRAVENOUS | Status: DC | PRN
  Administered 2015-11-15: 50 ug/min via INTRAVENOUS
  Administered 2015-11-15: 40 ug/min via INTRAVENOUS

## 2015-11-15 MED ORDER — HYDRALAZINE HCL 20 MG/ML IJ SOLN: 5.0000 mg | INTRAMUSCULAR | Status: DC | PRN

## 2015-11-15 MED ORDER — SUGAMMADEX SODIUM 200 MG/2ML IV SOLN
INTRAVENOUS | Status: AC
  Filled 2015-11-15: qty 2

## 2015-11-15 MED ORDER — PANTOPRAZOLE SODIUM 40 MG PO TBEC
40.0000 mg | DELAYED_RELEASE_TABLET | Freq: Every day | ORAL | Status: DC
  Administered 2015-11-16: 40 mg via ORAL
  Filled 2015-11-15 (×2): qty 1

## 2015-11-15 MED ORDER — ENSURE ENLIVE PO LIQD
237.0000 mL | Freq: Two times a day (BID) | ORAL | Status: DC
  Administered 2015-11-16: 237 mL via ORAL

## 2015-11-15 MED ORDER — OXYCODONE HCL 5 MG PO TABS
5.0000 mg | ORAL_TABLET | Freq: Once | ORAL | Status: DC | PRN
Start: 2015-11-15 — End: 2015-11-15

## 2015-11-15 MED ORDER — ONDANSETRON HCL 4 MG/2ML IJ SOLN: 4.0000 mg | Freq: Once | INTRAMUSCULAR | Status: DC | PRN

## 2015-11-15 MED ORDER — CEFAZOLIN SODIUM-DEXTROSE 2-3 GM-% IV SOLR
INTRAVENOUS | Status: DC | PRN
  Administered 2015-11-15: 2 g via INTRAVENOUS

## 2015-11-15 MED ORDER — PHENOL 1.4 % MT LIQD
1.0000 | OROMUCOSAL | Status: DC | PRN
Start: 2015-11-15 — End: 2015-11-16

## 2015-11-15 MED ORDER — ASPIRIN EC 81 MG PO TBEC
81.0000 mg | DELAYED_RELEASE_TABLET | Freq: Every day | ORAL | Status: DC
  Administered 2015-11-16: 81 mg via ORAL
  Filled 2015-11-15 (×2): qty 1

## 2015-11-15 MED ORDER — LISINOPRIL 10 MG PO TABS
20.0000 mg | ORAL_TABLET | Freq: Every day | ORAL | Status: DC
  Administered 2015-11-15: 20 mg via ORAL
  Filled 2015-11-15: qty 2

## 2015-11-15 MED ORDER — PROPOFOL 10 MG/ML IV BOLUS
INTRAVENOUS | Status: AC
  Filled 2015-11-15: qty 20

## 2015-11-15 MED ORDER — METOPROLOL TARTRATE 5 MG/5ML IV SOLN: 2.0000 mg | INTRAVENOUS | Status: DC | PRN

## 2015-11-15 MED ORDER — EUCERIN EX CREA: 1.0000 "application " | TOPICAL_CREAM | Freq: Two times a day (BID) | CUTANEOUS | Status: DC

## 2015-11-15 MED ORDER — CHLORHEXIDINE GLUCONATE CLOTH 2 % EX PADS: 6.0000 | MEDICATED_PAD | Freq: Once | CUTANEOUS | Status: DC

## 2015-11-15 MED ORDER — LIDOCAINE HCL (CARDIAC) 20 MG/ML IV SOLN
INTRAVENOUS | Status: DC | PRN
  Administered 2015-11-15: 25 mg via INTRAVENOUS

## 2015-11-15 MED ORDER — MUPIROCIN 2 % EX OINT
TOPICAL_OINTMENT | CUTANEOUS | Status: AC
  Filled 2015-11-15: qty 22

## 2015-11-15 MED ORDER — FENTANYL CITRATE (PF) 100 MCG/2ML IJ SOLN
25.0000 ug | INTRAMUSCULAR | Status: DC | PRN
  Administered 2015-11-15 (×2): 25 ug via INTRAVENOUS

## 2015-11-15 MED ORDER — DEXTROSE-NACL 5-0.45 % IV SOLN
INTRAVENOUS | Status: DC
  Administered 2015-11-15: 13:00:00 via INTRAVENOUS

## 2015-11-15 MED ORDER — BISACODYL 10 MG RE SUPP: 10.0000 mg | Freq: Every day | RECTAL | Status: DC | PRN

## 2015-11-15 MED ORDER — ALBUMIN HUMAN 5 % IV SOLN
INTRAVENOUS | Status: AC
  Administered 2015-11-15: 12.5 g via INTRAVENOUS
  Filled 2015-11-15: qty 250

## 2015-11-15 MED ORDER — POLYETHYLENE GLYCOL 3350 17 G PO PACK: 17.0000 g | PACK | Freq: Every day | ORAL | Status: DC | PRN

## 2015-11-15 MED ORDER — FLEET ENEMA 7-19 GM/118ML RE ENEM: 1.0000 | ENEMA | Freq: Once | RECTAL | Status: DC | PRN

## 2015-11-15 MED ORDER — PROPOFOL 10 MG/ML IV BOLUS
INTRAVENOUS | Status: DC | PRN
  Administered 2015-11-15: 100 mg via INTRAVENOUS
  Administered 2015-11-15: 30 mg via INTRAVENOUS

## 2015-11-15 MED ORDER — DOCUSATE SODIUM 100 MG PO CAPS
100.0000 mg | ORAL_CAPSULE | Freq: Every day | ORAL | Status: DC
  Administered 2015-11-16: 100 mg via ORAL
  Filled 2015-11-15: qty 1

## 2015-11-15 MED ORDER — TRAZODONE HCL 50 MG PO TABS
50.0000 mg | ORAL_TABLET | Freq: Every day | ORAL | Status: DC
  Administered 2015-11-15: 50 mg via ORAL
  Filled 2015-11-15: qty 1

## 2015-11-15 MED ORDER — ALBUMIN HUMAN 5 % IV SOLN
INTRAVENOUS | Status: AC
  Filled 2015-11-15: qty 250

## 2015-11-15 MED ORDER — FENTANYL CITRATE (PF) 100 MCG/2ML IJ SOLN
INTRAMUSCULAR | Status: DC | PRN
  Administered 2015-11-15 (×2): 50 ug via INTRAVENOUS

## 2015-11-15 MED ORDER — DONEPEZIL HCL 10 MG PO TABS
10.0000 mg | ORAL_TABLET | Freq: Every day | ORAL | Status: DC
  Administered 2015-11-15: 10 mg via ORAL
  Filled 2015-11-15: qty 1

## 2015-11-15 MED ORDER — 0.9 % SODIUM CHLORIDE (POUR BTL) OPTIME
TOPICAL | Status: DC | PRN
  Administered 2015-11-15: 1000 mL

## 2015-11-15 MED ORDER — FENTANYL CITRATE (PF) 100 MCG/2ML IJ SOLN
INTRAMUSCULAR | Status: AC
  Administered 2015-11-15: 25 ug via INTRAVENOUS
  Filled 2015-11-15: qty 2

## 2015-11-15 MED ORDER — ACETAMINOPHEN 325 MG RE SUPP: 325.0000 mg | RECTAL | Status: DC | PRN

## 2015-11-15 MED ORDER — MEMANTINE HCL 10 MG PO TABS
10.0000 mg | ORAL_TABLET | Freq: Two times a day (BID) | ORAL | Status: DC
  Administered 2015-11-15 – 2015-11-16 (×3): 10 mg via ORAL
  Filled 2015-11-15 (×3): qty 1

## 2015-11-15 MED ORDER — ACETAMINOPHEN 325 MG PO TABS: 325.0000 mg | ORAL_TABLET | ORAL | Status: DC | PRN

## 2015-11-15 SURGICAL SUPPLY — 52 items
BANDAGE ACE 6X5 VEL STRL LF (GAUZE/BANDAGES/DRESSINGS) ×3 IMPLANT
BANDAGE ELASTIC 4 VELCRO ST LF (GAUZE/BANDAGES/DRESSINGS) ×3 IMPLANT
BANDAGE ELASTIC 6 VELCRO ST LF (GAUZE/BANDAGES/DRESSINGS) ×3 IMPLANT
BANDAGE ESMARK 6X9 LF (GAUZE/BANDAGES/DRESSINGS) ×1 IMPLANT
BLADE SAW SAG 29X58X.64 (BLADE) ×3 IMPLANT
BNDG COHESIVE 6X5 TAN STRL LF (GAUZE/BANDAGES/DRESSINGS) ×3 IMPLANT
BNDG ESMARK 6X9 LF (GAUZE/BANDAGES/DRESSINGS) ×3
BNDG GAUZE ELAST 4 BULKY (GAUZE/BANDAGES/DRESSINGS) ×3 IMPLANT
CANISTER SUCTION 2500CC (MISCELLANEOUS) ×3 IMPLANT
CLIP TI MEDIUM 6 (CLIP) ×3 IMPLANT
COVER SURGICAL LIGHT HANDLE (MISCELLANEOUS) ×6 IMPLANT
COVER TABLE BACK 60X90 (DRAPES) ×3 IMPLANT
DRAIN CHANNEL 19F RND (DRAIN) IMPLANT
DRAPE ORTHO SPLIT 77X108 STRL (DRAPES) ×4
DRAPE PROXIMA HALF (DRAPES) ×3 IMPLANT
DRAPE SURG ORHT 6 SPLT 77X108 (DRAPES) ×2 IMPLANT
DRAPE U-SHAPE 47X51 STRL (DRAPES) ×3 IMPLANT
DRSG ADAPTIC 3X8 NADH LF (GAUZE/BANDAGES/DRESSINGS) ×3 IMPLANT
ELECT REM PT RETURN 9FT ADLT (ELECTROSURGICAL) ×3
ELECTRODE REM PT RTRN 9FT ADLT (ELECTROSURGICAL) ×1 IMPLANT
EVACUATOR SILICONE 100CC (DRAIN) IMPLANT
GAUZE SPONGE 4X4 12PLY STRL (GAUZE/BANDAGES/DRESSINGS) ×3 IMPLANT
GLOVE BIO SURGEON STRL SZ 6.5 (GLOVE) ×2 IMPLANT
GLOVE BIO SURGEON STRL SZ7 (GLOVE) ×3 IMPLANT
GLOVE BIO SURGEONS STRL SZ 6.5 (GLOVE) ×1
GLOVE BIOGEL PI IND STRL 6.5 (GLOVE) ×1 IMPLANT
GLOVE BIOGEL PI IND STRL 7.5 (GLOVE) ×1 IMPLANT
GLOVE BIOGEL PI INDICATOR 6.5 (GLOVE) ×2
GLOVE BIOGEL PI INDICATOR 7.5 (GLOVE) ×2
GLOVE SKINSENSE NS SZ7.0 (GLOVE) ×4
GLOVE SKINSENSE STRL SZ7.0 (GLOVE) ×2 IMPLANT
GOWN STRL REUS W/ TWL LRG LVL3 (GOWN DISPOSABLE) ×3 IMPLANT
GOWN STRL REUS W/TWL LRG LVL3 (GOWN DISPOSABLE) ×6
KIT BASIN OR (CUSTOM PROCEDURE TRAY) ×3 IMPLANT
KIT ROOM TURNOVER OR (KITS) ×3 IMPLANT
NS IRRIG 1000ML POUR BTL (IV SOLUTION) ×3 IMPLANT
PACK GENERAL/GYN (CUSTOM PROCEDURE TRAY) ×3 IMPLANT
PAD ARMBOARD 7.5X6 YLW CONV (MISCELLANEOUS) ×6 IMPLANT
SPONGE GAUZE 4X4 12PLY STER LF (GAUZE/BANDAGES/DRESSINGS) ×3 IMPLANT
STAPLER VISISTAT 35W (STAPLE) ×3 IMPLANT
STOCKINETTE IMPERVIOUS LG (DRAPES) ×3 IMPLANT
SUT ETHILON 3 0 PS 1 (SUTURE) IMPLANT
SUT SILK 0 TIES 10X30 (SUTURE) ×3 IMPLANT
SUT SILK 2 0 (SUTURE) ×2
SUT SILK 2-0 18XBRD TIE 12 (SUTURE) ×1 IMPLANT
SUT VIC AB 2-0 CT1 18 (SUTURE) ×6 IMPLANT
SUT VIC AB 3-0 SH 18 (SUTURE) ×3 IMPLANT
TAPE CLOTH SURG 4X10 WHT LF (GAUZE/BANDAGES/DRESSINGS) ×3 IMPLANT
TOWEL OR 17X24 6PK STRL BLUE (TOWEL DISPOSABLE) ×3 IMPLANT
TOWEL OR 17X26 10 PK STRL BLUE (TOWEL DISPOSABLE) ×3 IMPLANT
UNDERPAD 30X30 INCONTINENT (UNDERPADS AND DIAPERS) ×6 IMPLANT
WATER STERILE IRR 1000ML POUR (IV SOLUTION) ×3 IMPLANT

## 2015-11-15 NOTE — H&P (View-Only) (Signed)
Established Critical Limb Ischemia Patient  History of Present Illness  William Walters is a 82 y.o. (1933-01-15) male who presents with chief complaint: left 2nd toe ucler.  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  The patient recently under angiography with Dr. Trula Slade who felt he was not a candidate intra-procedure for endovascular revascularization.  The patient's L foot wounds have: clean up some per the patient.  Conversation with patient is limited due to the patient's dementia.    Past Medical History  Diagnosis Date  . Hypertension   . Stroke ([place])   . Alzheimer disease   . Cancer ([place])     Lung  . Headache     Past Surgical History  Procedure Laterality Date  . Fracture surgery    . Amputation Right 06/09/2015    Procedure: AMPUTATION ABOVE KNEE;  Surgeon: Angelia Mould, MD;  Location: [facility];  Service: Vascular;  Laterality: Right;  . Peripheral vascular catheterization N/A 09/21/2015    Procedure: Lower Extremity Angiography - Left Leg;  Surgeon: Serafina Mitchell, MD;  Location: [facility];  Service: Cardiovascular;  Laterality: N/A;    Social History   Social History  . Marital Status: Single    Spouse Name: N/A  . Number of Children: N/A  . Years of Education: N/A   Occupational History  . Not on file.   Social History Main Topics  . Smoking status: Former Smoker    Types: Cigarettes  . Smokeless tobacco: Never Used  . Alcohol Use: No  . Drug Use: No  . Sexual Activity: Not on file   Other Topics Concern  . Not on file   Social History Narrative   Family History: patient is unable to detail the medical history of his parents  Current Outpatient Prescriptions  Medication Sig Dispense Refill  . aspirin 81 MG tablet Take 81 mg by mouth daily. Reported on 09/15/2015    . Cholecalciferol (VITAMIN D-3 PO) Take 50,000 Units by mouth every 30 (thirty) days. Reported on 09/15/2015      . donepezil (ARICEPT) 10 MG tablet Take 10 mg by mouth at bedtime. Reported on 09/15/2015    . ENSURE PLUS (ENSURE PLUS) LIQD Take 237 mLs by mouth 2 (two) times daily between meals.    Marland Kitchen lisinopril (PRINIVIL,ZESTRIL) 20 MG tablet Take 1 tablet (20 mg total) by mouth daily.    . memantine (NAMENDA) 10 MG tablet Take 10 mg by mouth 2 (two) times daily.    Marland Kitchen nystatin cream (MYCOSTATIN) Apply 1 application topically 3 (three) times daily as needed for dry skin.     Marland Kitchen oxyCODONE (OXY IR/ROXICODONE) 5 MG immediate release tablet Take 1 tablet (5 mg total) by mouth every 4 (four) hours as needed for moderate pain. (Patient not taking: Reported on 09/15/2015) 30 tablet 0  . polyethylene glycol (MIRALAX / GLYCOLAX) packet Take 17 g by mouth daily as needed for mild constipation.    . traZODone (DESYREL) 50 MG tablet Take 50 mg by mouth at bedtime.     No current facility-administered medications for this visit.     No Known Allergies   REVIEW OF SYSTEMS:  (Positives checked otherwise negative)  Unable to obtain due to dementia  Physical Examination  Filed Vitals:   10/21/15 1127  BP: 136/82  Pulse: 102  Temp: 97.1 F (36.2 C)  Resp: 16  SpO2: 96%   There is no  weight on file to calculate BMI.  General: A&O x 3, WDWN, limited conversation due to dementia  Eyes: PERRL, EOMI  Pulmonary: Sym exp, good air movt, CTAB, no rales, rhonchi, & wheezing  Cardiac: RRR, Nl S1, S2, no Murmurs, rubs or gallops  Vascular: Vessel Right Left  Radial Palpable Palpable  Brachial Palpable Palpable  Carotid Palpable, without bruit Palpable, without bruit  Aorta Not palpable N/A  Femoral Palpable Palpable  Popliteal AKA Not palpable  PT AKA Not Palpable  DP AKA Not Palpable   Gastrointestinal: soft, NTND, no G/R, no HSM, no masses, no CVAT B  Musculoskeletal: Limited motor exam due to poor cooperation, R AKA, L foot: exposed L interphalangeal joint, no pus, no erythema, no TTP, start of ulcer on  L great toe  Neurologic: Limited exam due to dementia  Medical Decision Making  William Walters is a 82 y.o. male who presents with: LLE critical limb ischemia with exposed L 2nd interphalangeal joint, s/p R AKA   Somewhat difficult discussion due to the patient's dementia.  It seem apparent, however, that the patient is not interested in amputation at this point  Based on the patient's vascular studies and examination, I have offered the patient: L AKA.  He declined.  I would manage his wounds in a palliative fashion: pain medications as needle, wound care daily, and abx as needed.  I would paint the left foot wound with betadine and keep the wound bandaged daily.  In the event, the patient worsens or agrees to proceed with the L AKA, please contact us to schedule.  Thank you for allowing [place] to participate in this patient's care.   [name], MD, FACS [facility] Office: [phone] Pager: [phone]

## 2015-11-15 NOTE — Transfer of Care (Signed)
Immediate Anesthesia Transfer of Care Note  Patient: William Walters  Procedure(s) Performed: Procedure(s): Left leg AMPUTATION ABOVE KNEE (Left)  Patient Location: PACU  Anesthesia Type:General  Level of Consciousness: awake, alert , oriented and sedated  Airway & Oxygen Therapy: Patient Spontanous Breathing and Patient connected to nasal cannula oxygen  Post-op Assessment: Report given to RN, Post -op Vital signs reviewed and stable and Patient moving all extremities  Post vital signs: Reviewed and stable  Last Vitals:  Filed Vitals:   11/15/15 0622  BP: 166/87  Pulse: 86  Temp: 37 C  Resp: 20    Last Pain: There were no vitals filed for this visit.    Patients Stated Pain Goal: 4 (84/78/41 2820)  Complications: No apparent anesthesia complications

## 2015-11-15 NOTE — Anesthesia Preprocedure Evaluation (Signed)
Anesthesia Evaluation  Patient identified by MRN, date of birth, ID band Patient awake    Reviewed: Allergy & Precautions, NPO status , Patient's Chart, lab work & pertinent test results  Airway Mallampati: II  TM Distance: >3 FB     Dental  (+) Edentulous Upper, Edentulous Lower   Pulmonary former smoker,    breath sounds clear to auscultation       Cardiovascular hypertension,  Rhythm:Regular Rate:Normal     Neuro/Psych    GI/Hepatic   Endo/Other    Renal/GU      Musculoskeletal   Abdominal   Peds  Hematology   Anesthesia Other Findings   Reproductive/Obstetrics                             Anesthesia Physical Anesthesia Plan  ASA: III  Anesthesia Plan: General   Post-op Pain Management:    Induction: Intravenous  Airway Management Planned: Oral ETT  Additional Equipment:   Intra-op Plan:   Post-operative Plan: Extubation in OR  Informed Consent: I have reviewed the patients History and Physical, chart, labs and discussed the procedure including the risks, benefits and alternatives for the proposed anesthesia with the patient or authorized representative who has indicated his/her understanding and acceptance.     Plan Discussed with: CRNA and Anesthesiologist  Anesthesia Plan Comments:         Anesthesia Quick Evaluation

## 2015-11-15 NOTE — Consult Note (Signed)
Date: 11/15/2015               Patient Name:  William Walters MRN: [id number]  DOB: 06/25/1932 Age / Sex: 82 y.o., male   PCP: Janifer Adie, MD         Requesting Physician: Dr. Conrad Leeds, MD    Consulting Reason:  Vascular Dementia     Chief Complaint: left leg pain  History of Present Illness: Mr. Perine is an 82 yo male with PMHx of vascular dementia, HTN, and peripheral vascular occlusive disease with limb ischemia who presented today for a left AKA for PVD with gangrene of his left lower extremity by Dr. Bridgett Larsson, Vascular Surgery.   Patient originally presented for this procedure on 7/14 but was found to be hypotensive in pre-op and poorly responsive. A code blue was subsequently called, but patient never lost pulse or consciousness. He was transported to th ED and resuscitated with NS and given broad spectrum antibiotics. Patient was admitted to the IMTS for further care. UA was concerning for an uncomplicated UTI and patient was treated with IV Ceftriaxone and later transitioned to po Ciprofloxacin 250 mg BID to complete a 3 day course. He was discharged on 11/12/15 to [facility] for respite prior to his scheduled left AKA on 11/15/15. IMTS was consulted for management of patient's chronic medical conditions after his left AKA this morning.  In pre-op, patient was afebrile at 98.8, HTN at 166/87, HR 86, RR 20 satting 99% on room air. CBC showed no leukocytosis at 10.3, hgb 10.8. BMET was within normal limits. Patient underwent a successful left AKA without stated complications and about 30 cc of blood loss.  Patient was seen and examined in the PACU. Patient is understandably somnolent post-procedure. He does not answer my questions but follows commands.   Meds: Current Facility-Administered Medications  Medication Dose Route Frequency Provider Last Rate Last Dose  . acetaminophen (TYLENOL) tablet 325-650 mg  325-650 mg Oral Q4H PRN Conrad Dickenson, MD       Or  .  acetaminophen (TYLENOL) suppository 325-650 mg  325-650 mg Rectal Q4H PRN Conrad [place], MD      . albumin human 5 % solution           . alum & mag hydroxide-simeth (MAALOX/MYLANTA) 200-200-20 MG/5ML suspension 15-30 mL  15-30 mL Oral Q2H PRN Conrad [place], MD      . aspirin EC tablet 81 mg  81 mg Oral Daily Conrad [place], MD      . bisacodyl (DULCOLAX) suppository 10 mg  10 mg Rectal Daily PRN Conrad [place], MD      . cefUROXime (ZINACEF) 1.5 g in dextrose 5 % 50 mL IVPB  1.5 g Intravenous Q12H Conrad [place], MD      . dextrose 5 %-0.45 % sodium chloride infusion   Intravenous Continuous Conrad [place], MD      . Derrill Memo ON 11/16/2015] docusate sodium (COLACE) capsule 100 mg  100 mg Oral Daily Conrad [place], MD      . donepezil (ARICEPT) tablet 10 mg  10 mg Oral QHS Conrad [place], MD      . Derrill Memo ON 11/16/2015] enoxaparin (LOVENOX) injection 40 mg  40 mg Subcutaneous Q24H Conrad [place], MD      . feeding supplement (ENSURE ENLIVE) (ENSURE ENLIVE) liquid 237 mL  237 mL Oral BID BM Conrad , MD      . guaiFENesin-dextromethorphan [facility]  DM) 100-10 MG/5ML syrup 15 mL  15 mL Oral Q4H PRN Conrad [place], MD      . hydrALAZINE (APRESOLINE) injection 5 mg  5 mg Intravenous Q20 Min PRN Conrad [place], MD      . labetalol (NORMODYNE,TRANDATE) injection 10 mg  10 mg Intravenous Q10 min PRN Conrad [place], MD      . lisinopril (PRINIVIL,ZESTRIL) tablet 20 mg  20 mg Oral Daily Conrad [place], MD      . magnesium sulfate IVPB 2 g 50 mL  2 g Intravenous Daily PRN Conrad Walsh, MD      . memantine [facility]) tablet 10 mg  10 mg Oral BID Conrad [place], MD      . metoprolol (LOPRESSOR) injection 2-5 mg  2-5 mg Intravenous Q2H PRN Conrad [place], MD      . morphine 2 MG/ML injection 2-5 mg  2-5 mg Intravenous Q1H PRN Conrad [place], MD      . mupirocin ointment (BACTROBAN) 2 %           . ondansetron (ZOFRAN) injection 4 mg  4 mg Intravenous Q6H PRN Conrad [place], MD      . oxyCODONE-acetaminophen (PERCOCET/ROXICET) 5-325 MG per  tablet 1-2 tablet  1-2 tablet Oral Q6H PRN Conrad [place], MD      . pantoprazole (PROTONIX) EC tablet 40 mg  40 mg Oral Daily Conrad [place], MD      . phenol (CHLORASEPTIC) mouth spray 1 spray  1 spray Mouth/Throat PRN Conrad [place], MD      . polyethylene glycol (MIRALAX / GLYCOLAX) packet 17 g  17 g Oral Daily PRN Conrad [place], MD      . potassium chloride SA (K-DUR,KLOR-CON) CR tablet 20-40 mEq  20-40 mEq Oral Daily PRN Conrad [place], MD      . sodium phosphate (FLEET) 7-19 GM/118ML enema 1 enema  1 enema Rectal Once PRN Conrad [place], MD      . traZODone (DESYREL) tablet 50 mg  50 mg Oral QHS Conrad [place], MD      . Derrill Memo ON 11/29/2015] Vitamin D (Ergocalciferol) (DRISDOL) capsule 50,000 Units  50,000 Units Oral Q30 days Conrad , MD        Allergies: Allergies as of 11/07/2015  . (No Known Allergies)   Past Medical History  Diagnosis Date  . Hypertension   . Stroke ([place])   . Alzheimer disease   . Cancer ([place])     Lung  . Headache   . Moderate protein-calorie malnutrition [facility])    Past Surgical History  Procedure Laterality Date  . Fracture surgery    . Amputation Right 06/09/2015    Procedure: AMPUTATION ABOVE KNEE;  Surgeon: Angelia Mould, MD;  Location: [place];  Service: Vascular;  Laterality: Right;  . Peripheral vascular catheterization N/A 09/21/2015    Procedure: Lower Extremity Angiography - Left Leg;  Surgeon: Serafina Mitchell, MD;  Location: [place] [facility];  Service: Cardiovascular;  Laterality: N/A;   History reviewed. No pertinent family history. Social History   Social History  . Marital Status: Single    Spouse Name: N/A  . Number of Children: N/A  . Years of Education: N/A   Occupational History  . Not on file.   Social History Main Topics  . Smoking status: Former Smoker    Types: Cigarettes  . Smokeless tobacco: Never Used  . Alcohol Use: No  .  Drug Use: No  . Sexual Activity: Not on file   Other Topics Concern  . Not on file   Social  History Narrative    Review of Systems: Level V Caveat 2/2 Dementia and Somnolence post-procedure A complete ROS was negative except as per HPI.   Physical Exam: Filed Vitals:   11/15/15 0956 11/15/15 1000 11/15/15 1015 11/15/15 1045  BP:  110/64 115/77 129/60  Pulse: 81 81 79 85  Temp:  97.5 F (36.4 C)    Resp: '16 11 11   '$ Weight:      SpO2: 100% 100% 100% 100%   General: Vital signs reviewed.  Patient is thin appearing, in no acute distress and cooperative with exam.  Head: Normocephalic and atraumatic. Eyes: Bilateral miosis, PERRLA, no scleral icterus.  Neck: Supple, trachea midline, no carotid bruit present.  Cardiovascular: RRR, 2/6 systolic ejection murmur at LUSB, no gallops or rubs. Pulmonary/Chest: Clear to auscultation bilaterally, no wheezes, rales, or rhonchi. Abdominal: Soft, non-tender, non-distended, decreased BS.  Extremities: S/p Bilateral AKAs. Right AKA well healed and intact, warm without edema. Left AKA well wrapped post procedure.  Neurological: Awake, opens eyes to questions, follows simple commands.   Skin: Warm, dry and intact. No rashes or erythema. Psychiatric: Unable to assess.   Lab results: Basic Metabolic Panel:  Recent Labs  11/15/15 0651  NA 139  K 3.8  CL 107  CO2 26  GLUCOSE 111*  BUN 15  CREATININE 0.93  CALCIUM 8.9   CBC:  Recent Labs  11/15/15 0651  WBC 10.3  HGB 10.8*  HCT 33.7*  MCV 91.6  PLT 229   CBG:  Recent Labs  11/15/15 0912  GLUCAP 115*   Urine Drug Screen: Drugs of Abuse     Component Value Date/Time   LABOPIA NONE DETECTED 02/02/2013 1529   COCAINSCRNUR NONE DETECTED 02/02/2013 1529   LABBENZ NONE DETECTED 02/02/2013 1529   AMPHETMU NONE DETECTED 02/02/2013 1529   THCU NONE DETECTED 02/02/2013 1529   LABBARB NONE DETECTED 02/02/2013 1529     Assessment, Plan, & Recommendations by Problem: Principal Problem:   Extremity atherosclerosis with gangrene ([place]) Active Problems:   Vascular  dementia, uncomplicated   Lung cancer ([place])   Essential hypertension, benign   S/P AKA (above knee amputation) bilateral [facility])  Mr. Lehrke is an 82 yo male with PMHx of vascular dementia, HTN, and peripheral vascular occlusive disease with limb ischemia who presented today for a left AKA for PVD with gangrene of his left lower extremity by Dr. Bridgett Larsson, Vascular Surgery.   Peripheral Atherosclerosis with Gangrene s/p Left AKA: Patient underwent a successful Left AKA on the morning of 7/18. Patient was hypotensive post-op at 86/53 which responded to albumin and returned to 115/77. Vascular surgery is following along for further management.  -Vascular surgery following -D5-1/2 NS 75 cc/hr -Cefuroxime 1.5 mg IV BID -Oxycodone 5 mg 1-2 tabs Q6H prn  -Morphine 2 mg IV Q1H prn severe pain -Zofran 4 mg Q6H prn  -ASA 81 mg daily -Repeat CBC/BMET tomorrow am -Elevate left lower extremity -I/Os, Neurovascular checks -PT/OT   Vascular Dementia/Insomnia: Patient has long history of dementia with aggression and delusions in the past which has been attributed to vascular dementia. He is on memantine 10 mg BID, donepezil 10 mg QHS at home, and trazodone 50 mg QHS at home.  -Continue Memantine 10 mg BID -Continue Donepezil 10 mg QHS -Continue Trazodone 50 mg QHS  HTN: 166/87 on admission. Hypotensive after surgery at 86/53  which improved to 115/77 with albumin and IVF. Patient is on lisinopril 20 mg daily at home. Will continue lisinopril, but caution for hypotension with recent surgery and pain medication. -Continue home lisinopril 20 mg daily -Hydralazine and Labetalol prn high blood pressure per vascular surgery, caution for hypotension  Constipation: Patient is on Miralax QD at home.  -Continue home Miralax QD prn  -Colace 100 mg QD -Dulcolax 10 mg daily prn   H/o NSCL of the Left Lung: Diagnosed in 2014. Patient has undergone a conservative approach with radiation therapy. He has not followed up  with radiation oncology since 2015.   H/o PE: Found in 2014. Completed course of Xarelto.  DVT/PE: Lovenox SQ QD FEN: Clear liquids, ADAT CODE: DNR- I confirmed with [facility] that patient has DNR paperwork signed.   Dispo: Disposition is deferred at this time, awaiting improvement of current medical problems. Anticipated discharge in approximately 3 day(s).   The patient does have a current PCP Janifer Adie, MD) and does not need an [facility] hospital follow-up appointment after discharge.  The patient does have transportation limitations that hinder transportation to clinic appointments.  Signed: Martyn Malay, DO PGY-3 Internal Medicine Resident Pager # [phone] 11/15/2015 11:03 AM

## 2015-11-15 NOTE — Op Note (Signed)
    OPERATIVE NOTE   PROCEDURE: left above-the-knee amputation  PRE-OPERATIVE DIAGNOSIS: left foot gangrene  POST-OPERATIVE DIAGNOSIS: same as above  SURGEON: [name], MD  ASSISTANT(S): RNFA  ANESTHESIA: general  ESTIMATED BLOOD LOSS: 30 cc  FINDING(S): 1.  Viable left thigh muscle 2.  Severely calcified L SFA  SPECIMEN(S):  left above-the-knee amputation  INDICATIONS:   William Walters is a 82 y.o. male who presents with left footgrene.  The patient is scheduled for a left above-the-knee amputation.  I discussed in depth with the patient the risks, benefits, and alternatives to this procedure.  The patient is aware that the risk of this operation included but are not limited to:  bleeding, infection, myocardial infarction, stroke, death, failure to heal amputation wound, and possible need for more proximal amputation.  The patient is aware of the risks and agrees proceed forward with the procedure.  DESCRIPTION: After full informed written consent was obtained from the patient, the patient was brought back to the operating room, and placed supine upon the operating table.  Prior to induction, the patient received IV antibiotics.  The patient was then prepped and draped in the standard fashion for an above-the-knee amputation.  I placed a non-sterile tourniquet on the thigh prior to the procedure.  After obtaining adequate anesthesia, the patient was prepped and draped in the standard fashion for a above-the-knee amputation.  I marked out the anterior and posterior flaps for a fish-mouth type of amputation.  I exsanguinated the leg with an Esmarch bandage and then inflated the tourniquet to 250 mm Hg.   I made the incisions for these flaps, and then dissected through the subcutaneous tissue, fascia, and muscles circumferentially.  I elevated  the periosteal tissue 4 cm more proximal than the anterior skin flap.  I then transected the femur with a power saw at this level.  Then I  smoothed out the rough edges of the bone with a rasp.  At this point, the specimen was passed off the field as the above-the-knee amputation.  At this point, I clamped all visibly bleeding arteries and veins using a combination of suture ligation with Vicryl suture and electrocautery.  The tourniquet was then deflated at this point.  Bleeding continued to be controlled with electrocautery and suture ligature.  The stump was washed off with sterile normal saline and no further active bleeding was noted.  I reapproximated the anterior and posterior fascia  with interrupted stitches of 2-0 Vicryl.  This was completed along the entire length of anterior and posterior fascia until there were no more loose space in the fascial line.  The skin was then  reapproximated with staples.  The stump was washed off and dried.  The incision was dressed with [employer] and  then fluffs were applied.  Kerlix was wrapped around the leg and then gently an ACE wrap was applied.     COMPLICATIONS: none  CONDITION: stable   [name], MD [facility] [place] Office: [phone] Pager: [phone]  11/15/2015, 8:53 AM

## 2015-11-15 NOTE — Progress Notes (Signed)
Pt arrived to 2w from PACU. Pt was found to be wearing 2 adult briefs that were soiled with urine. Pt has been cleaned up and condom catheter was placed. Telemetry box has been applied and CCMD was notified. Pt's vitals are stable. Pt received pain medication once meds were reviewed and verified by pharmacy. Pt is resting in bed with call light within reach. Will continue to monitor.   Grant Fontana BSN, RN

## 2015-11-15 NOTE — Progress Notes (Signed)
Thank you for consult on [name]. Chart reviewed and note that patient is a resident of SNF with history of R-AKA who was admitted for [place] 07/18. Would recommend therapy at resident facility after discharge. Will defer CIR consult.

## 2015-11-15 NOTE — Interval H&P Note (Signed)
History and Physical Interval Note:  11/15/2015 7:23 AM  William Walters  has presented today for surgery, with the diagnosis of Peripheral vascular disease with gangrene left leg  The various methods of treatment have been discussed with the patient and family. After consideration of risks, benefits and other options for treatment, the patient has consented to  Procedure(s): AMPUTATION ABOVE KNEE (Left) as a surgical intervention .  The patient's history has been reviewed, patient examined, no change in status, stable for surgery.  I have reviewed the patient's chart and labs.  Questions were answered to the patient's satisfaction.     [name]

## 2015-11-15 NOTE — Anesthesia Postprocedure Evaluation (Signed)
Anesthesia Post Note  Patient: William Walters  Procedure(s) Performed: Procedure(s) (LRB): Left leg AMPUTATION ABOVE KNEE (Left)  Patient location during evaluation: PACU Anesthesia Type: General Level of consciousness: awake, awake and alert and oriented Pain management: pain level controlled Vital Signs Assessment: post-procedure vital signs reviewed and stable Respiratory status: spontaneous breathing, nonlabored ventilation and respiratory function stable Cardiovascular status: blood pressure returned to baseline    Last Vitals:  Filed Vitals:   11/15/15 1242 11/15/15 1303  BP: 129/64 129/63  Pulse:  77  Temp:  36.6 C  Resp:  17    Last Pain:  Filed Vitals:   11/15/15 1618  PainSc: Asleep                 Bedelia Pong COKER

## 2015-11-15 NOTE — Anesthesia Procedure Notes (Signed)
Procedure Name: Intubation Date/Time: 11/15/2015 7:41 AM Performed by: Scheryl Darter Pre-anesthesia Checklist: Patient identified, Emergency Drugs available, Suction available and Patient being monitored Patient Re-evaluated:Patient Re-evaluated prior to inductionOxygen Delivery Method: Circle System Utilized Preoxygenation: Pre-oxygenation with 100% oxygen Intubation Type: IV induction Ventilation: Mask ventilation without difficulty Laryngoscope Size: Mac and 4 Grade View: Grade I Tube type: Oral Tube size: 7.5 mm Number of attempts: 1 Airway Equipment and Method: Stylet and Oral airway Placement Confirmation: ETT inserted through vocal cords under direct vision,  positive ETCO2 and breath sounds checked- equal and bilateral Tube secured with: Tape Dental Injury: Teeth and Oropharynx as per pre-operative assessment

## 2015-11-16 ENCOUNTER — Encounter (HOSPITAL_COMMUNITY): Payer: Self-pay | Admitting: Vascular Surgery

## 2015-11-16 DIAGNOSIS — I70262 Atherosclerosis of native arteries of extremities with gangrene, left leg: Principal | ICD-10-CM

## 2015-11-16 LAB — CULTURE, BLOOD (ROUTINE X 2)
CULTURE: NO GROWTH
Culture: NO GROWTH

## 2015-11-16 LAB — CBC
HCT: 29.3 % — ABNORMAL LOW (ref 39.0–52.0)
HEMOGLOBIN: 9.8 g/dL — AB (ref 13.0–17.0)
MCH: 30.2 pg (ref 26.0–34.0)
MCHC: 33.4 g/dL (ref 30.0–36.0)
MCV: 90.2 fL (ref 78.0–100.0)
Platelets: 189 10*3/uL (ref 150–400)
RBC: 3.25 MIL/uL — AB (ref 4.22–5.81)
RDW: 15.4 % (ref 11.5–15.5)
WBC: 19.8 10*3/uL — ABNORMAL HIGH (ref 4.0–10.5)

## 2015-11-16 LAB — BASIC METABOLIC PANEL
ANION GAP: 8 (ref 5–15)
BUN: 10 mg/dL (ref 6–20)
CALCIUM: 8.3 mg/dL — AB (ref 8.9–10.3)
CHLORIDE: 103 mmol/L (ref 101–111)
CO2: 23 mmol/L (ref 22–32)
CREATININE: 0.76 mg/dL (ref 0.61–1.24)
GFR calc Af Amer: >60 mL/min (ref >60)
GFR calc non Af Amer: >60 mL/min (ref >60)
GLUCOSE: 134 mg/dL — AB (ref 65–99)
Potassium: 4 mmol/L (ref 3.5–5.1)
Sodium: 134 mmol/L — ABNORMAL LOW (ref 135–145)

## 2015-11-16 MED ORDER — LISINOPRIL 20 MG PO TABS: 20.0000 mg | ORAL_TABLET | Freq: Every day | ORAL | Status: AC

## 2015-11-16 MED ORDER — OXYCODONE-ACETAMINOPHEN 5-325 MG PO TABS: 1.0000 | ORAL_TABLET | Freq: Four times a day (QID) | ORAL | Status: DC | PRN

## 2015-11-16 MED ORDER — SODIUM CHLORIDE 0.9 % IV BOLUS (SEPSIS)
1000.0000 mL | Freq: Once | INTRAVENOUS | Status: AC
  Administered 2015-11-16: 1000 mL via INTRAVENOUS

## 2015-11-16 NOTE — NC FL2 (Addendum)
[place] LEVEL OF CARE SCREENING TOOL     IDENTIFICATION  Patient Name: William Walters Birthdate: 10/07/1932 Sex: male Admission Date (Current Location): 11/15/2015  [facility] and [place] Number:  Herbalist and Address:  [facility] [place] [address], [place], [place] [address]      Provider Number: [id number]  Attending Physician Name and Address:  Conrad [place], MD  Relative Name and Phone Number:       Current Level of Care: Hospital Recommended Level of Care: [place] Prior Approval Number:    Date Approved/Denied:   PASRR Number: [id number]  Discharge Plan: SNF    Current Diagnoses: Patient Active Problem List   Diagnosis Date Noted  . Extremity atherosclerosis with gangrene ([place]) 11/15/2015  . S/P AKA (above knee amputation) bilateral ([place]) 11/15/2015  . Pressure ulcer 11/12/2015  . 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 Late effects of CVA (cerebrovascular accident) 04/20/2013  . Essential hypertension, benign 04/07/2013  . 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 Fall at home 09/21/2012  . Pulmonary embolism ([place]) 09/21/2012  . Lung cancer ([place]) 08/29/2012  . Vascular dementia, uncomplicated     Orientation RESPIRATION BLADDER Height & Weight      (dementia)  O2 (2 L/min) Incontinent Weight: 130 lb (58.968 kg) Height:     BEHAVIORAL SYMPTOMS/MOOD NEUROLOGICAL BOWEL NUTRITION STATUS   (none)  (none) Incontinent  (Full liquid )  AMBULATORY STATUS COMMUNICATION OF NEEDS Skin   Extensive Assist Verbally Surgical wounds, PU Stage and Appropriate Care (PU unstageable on RT Hip. Incision Closed: LT Leg)                       Personal Care Assistance Level of Assistance  Bathing, Feeding, Dressing Bathing Assistance: Limited assistance Feeding assistance: Independent Dressing Assistance: Limited assistance     Functional Limitations Info  Sight, Hearing, Speech Sight Info:  Adequate Hearing Info: Adequate Speech Info: Adequate    SPECIAL CARE FACTORS FREQUENCY  PT (By licensed PT), OT (By licensed OT)     PT Frequency: 5/ week OT Frequency: 5/ week            Contractures Contractures Info: Not present    Additional Factors Info  Code Status, Allergies, Psychotropic Code Status Info: DNR Allergies Info: NKDA Psychotropic Info: traZODone (DESYREL) tablet 50 mg Dose: 50 mg Freq: Daily at bedtime Route: PO         Current Medications (11/16/2015):  This is the current hospital active medication list Current Facility-Administered Medications  Medication Dose Route Frequency Provider Last Rate Last Dose  . acetaminophen (TYLENOL) tablet 325-650 mg  325-650 mg Oral Q4H PRN Conrad [place], MD       Or  . acetaminophen (TYLENOL) suppository 325-650 mg  325-650 mg Rectal Q4H PRN Conrad [place], MD      . alum & mag hydroxide-simeth (MAALOX/MYLANTA) 200-200-20 MG/5ML suspension 15-30 mL  15-30 mL Oral Q2H PRN Conrad [place], MD      . aspirin EC tablet 81 mg  81 mg Oral Daily Conrad [place], MD   81 mg at 11/16/15 1033  . bisacodyl (DULCOLAX) suppository 10 mg  10 mg Rectal Daily PRN Conrad [place], MD      . dextrose 5 %-0.45 % sodium chloride infusion   Intravenous Continuous Conrad [place], MD 75 mL/hr at 11/15/15 1230    . docusate sodium (COLACE) capsule 100 mg  100 mg Oral Daily Conrad [place], MD   100 mg at 11/16/15 1033  . donepezil (ARICEPT) tablet 10 mg  10 mg Oral QHS Conrad [place], MD   10 mg at 11/15/15 2254  . enoxaparin (LOVENOX) injection 40 mg  40 mg Subcutaneous Q24H Conrad [place], MD   40 mg at 11/16/15 1033  . feeding supplement (ENSURE ENLIVE) (ENSURE ENLIVE) liquid 237 mL  237 mL Oral BID BM Conrad [place], MD   237 mL at 11/16/15 1032  . guaiFENesin-dextromethorphan (ROBITUSSIN DM) 100-10 MG/5ML syrup 15 mL  15 mL Oral Q4H PRN Conrad [place], MD      . magnesium sulfate IVPB 2 g 50 mL  2 g Intravenous Daily PRN Conrad [place], MD      . memantine  [facility]) tablet 10 mg  10 mg Oral BID Conrad [place], MD   10 mg at 11/16/15 1033  . metoprolol (LOPRESSOR) injection 2-5 mg  2-5 mg Intravenous Q2H PRN Conrad [place], MD      . morphine 2 MG/ML injection 2-5 mg  2-5 mg Intravenous Q1H PRN Conrad [place], MD   4 mg at 11/15/15 1729  . ondansetron (ZOFRAN) injection 4 mg  4 mg Intravenous Q6H PRN Conrad [place], MD      . oxyCODONE-acetaminophen (PERCOCET/ROXICET) 5-325 MG per tablet 1-2 tablet  1-2 tablet Oral Q6H PRN Conrad [place], MD   2 tablet at 11/16/15 1033  . pantoprazole (PROTONIX) EC tablet 40 mg  40 mg Oral Daily Conrad [place], MD   40 mg at 11/16/15 1033  . phenol (CHLORASEPTIC) mouth spray 1 spray  1 spray Mouth/Throat PRN Conrad [place], MD      . polyethylene glycol (MIRALAX / GLYCOLAX) packet 17 g  17 g Oral Daily PRN Conrad [place], MD      . potassium chloride SA (K-DUR,KLOR-CON) CR tablet 20-40 mEq  20-40 mEq Oral Daily PRN Conrad [place], MD      . sodium phosphate (FLEET) 7-19 GM/118ML enema 1 enema  1 enema Rectal Once PRN Conrad [place], MD      . traZODone (DESYREL) tablet 50 mg  50 mg Oral QHS Conrad [place], MD   50 mg at 11/15/15 2254  . [START ON 11/29/2015] Vitamin D (Ergocalciferol) (DRISDOL) capsule 50,000 Units  50,000 Units Oral Q30 days Conrad [place], MD         Discharge Medications: Please see discharge summary for a list of discharge medications.  Relevant Imaging Results:  Relevant Lab Results:   Additional Information SS#: [id number]  Caryl Ada A Aycock, LCSW    Addendum  As noted above.  [name], MD [facility] [place] Office: [phone] Pager: [phone]  11/16/2015, 3:58 PM

## 2015-11-16 NOTE — Progress Notes (Signed)
Subjective: Patient appears comfortable this morning, laying in bed. No events overnight.   Objective: Vital signs in last 24 hours: Filed Vitals:   11/15/15 1242 11/15/15 1303 11/15/15 2217 11/16/15 0515  BP: 129/64 129/63 130/98 92/44  Pulse:  77 105 95  Temp:  97.8 F (36.6 C) 99.6 F (37.6 C) 100 F (37.8 C)  TempSrc:  Oral Oral Oral  Resp:  17 18 20   Weight:      SpO2:  99% 93% 96%   Labs:  Ref. Range 11/16/2015 04:09  Sodium Latest Ref Range: 135-145 mmol/L 134 (L)  Potassium Latest Ref Range: 3.5-5.1 mmol/L 4.0  Chloride Latest Ref Range: 101-111 mmol/L 103  CO2 Latest Ref Range: 22-32 mmol/L 23  BUN Latest Ref Range: 6-20 mg/dL 10  Creatinine Latest Ref Range: 0.61-1.24 mg/dL 0.76  Calcium Latest Ref Range: 8.9-10.3 mg/dL 8.3 (L)  EGFR (Non-African Amer.) Latest Ref Range: >60 mL/min >60  EGFR (African American) Latest Ref Range: >60 mL/min >60  Glucose Latest Ref Range: 65-99 mg/dL 134 (H)  Anion gap Latest Ref Range: 5-15  8  WBC Latest Ref Range: 4.0-10.5 K/uL 19.8 (H)  RBC Latest Ref Range: 4.22-5.81 MIL/uL 3.25 (L)  Hemoglobin Latest Ref Range: 13.0-17.0 g/dL 9.8 (L)  HCT Latest Ref Range: 39.0-52.0 % 29.3 (L)  MCV Latest Ref Range: 78.0-100.0 fL 90.2  MCH Latest Ref Range: 26.0-34.0 pg 30.2  MCHC Latest Ref Range: 30.0-36.0 g/dL 33.4  RDW Latest Ref Range: 11.5-15.5 % 15.4  Platelets Latest Ref Range: 150-400 K/uL 189    Physical Exam Constitutional: Elderly cachetic man laying in bed, NAD HEENT: Atraumatic, normocephalic, anicteric slcera Neck: Supple, trachea midline  Cardiovascular: RRR, 2/6 systolic ejection murmur at LUSB, no rubs or gallops Pulmonary/Chest: CTAB, breathing comfortably on RA. No wheezes, rales, or rhonchi  Abdominal: Soft, non tender, non distended. Normal BS GU: Condom catheter in place. Extremities: Bilateral AKAs. R AKA with well healed scar. L AKA well wrapped post procedure. Warm and well perfused.  Neurological: Alert and  oriented to person and place but not time.  Skin: Warm, dry, and intact. No rashes or erythema.  Psychiatric: Unable to assess due to dementia.  Assessment/Plan: William Walters is an 82 yo male with pmhx significant for vascular dementia, HTN, PVD and limb ischemia s/p R AKA who presented for scheduled L AKA now POD1.   PVD with L toe gangrene: Now s/p successful Left AKA on the morning of 7/18.  -Vascular surgery following -D5-1/2 NS 75 cc/hr -Cefuroxime 1.5 mg IV BID -Percocet 1-2 tablets q6hrs prn -Morphine 2 mg IV Q1H prn severe pain -Zofran 4 mg Q6H prn  -ASA 81 mg daily -Elevate left lower extremity -I/Os, Neurovascular checks -Likely discharge back to SNF today pending PT/OT evaluation.   Hypotension: Hypotensive after surgery at 86/53 which improved to 115/77 with albumin and IVF. Found to be hypotensive again this morning at 92/44. Possibly 2/2 to dehydration vs opoid medications.  - Morning lisinopril held - 1L NS bolus  - Recheck this afternoon  Constipation: Patient is on Miralax QD at home.  -Continue home Miralax QD prn  -Colace 100 mg QD -Dulcolax 10 mg daily prn   HTN: 166/87 on admission. Takes lisinopril 33m daily at home. However, patient has experienced 2 episodes of hypotension since his surgery. - Holding home lisinopril this morning  - Discontinue hydralazine and labetalol prn  - IVFs  - Continue to monitor   H/o NSCL of the Left Lung: Diagnosed in  2014. Patient has undergone a conservative approach with radiation therapy. He has not followed up with radiation oncology since 2015.   H/o PE: Found in 2014. Completed course of Xarelto.  DVT/PE: Lovenox SQ QD FEN: Clear liquids, ADAT CODE: DNR- confirmed with [facility] that patient has DNR paperwork signed.   Dispo: Anticipated discharge in approximately 1-2 day(s).   LOS: 1 day   William Ochs, MD 11/16/2015, 9:29 AM Pager: [phone]

## 2015-11-16 NOTE — Progress Notes (Signed)
Orthopedic Tech Progress Note Patient Details:  William Walters 05/18/1932 [id number]  Patient ID: William Walters, male   DOB: December 20, 1932, 82 y.o.   MRN: [id number] Called in bio-tech brace order; spoke with William Walters, William Walters 11/16/2015, 9:23 AM

## 2015-11-16 NOTE — Evaluation (Signed)
Physical Therapy Evaluation Patient Details Name: William Walters MRN: [id number] DOB: 04/24/1933 Today's Date: 11/16/2015   History of Present Illness  This is a 82 y.o. male who presented for evaluation of a left 2nd toe ucler. 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 The patient recently under angiography with Dr. Trula Slade who felt he was not a candidate intra-procedure for endovascular revascularization. The patient's L foot wounds have: clean up some per the patient. Conversation with patient is limited due to the patient's dementia.He was seen in the pre-op clinic on 11/12/15 where he became hypotensive and was transferred to the [facility] ED. He was fluid resuscitated. He was admitted by the Internal Medicine Teaching Service for further evaluation. He was found to have a UTI and treated with ciprofloxacin for three days. His hypotension was resolved with volume resuscitation. He was discharged back to his SNF the following day. Underwent left AKA on 11/15/15.    Clinical Impression  Pt admitted with above diagnosis. Pt currently with functional limitations due to the deficits listed below (see PT Problem List). Pt very low level with pain limiting mobility quite a bit. Will need SNF for therapy and pt and daughter agree.   Pt will benefit from skilled PT to increase their independence and safety with mobility to allow discharge to the venue listed below.      Follow Up Recommendations SNF;Supervision/Assistance - 24 hour    Equipment Recommendations  Other (comment) (TBA, probable need for motorized wheelchair, pressure cushio)    Recommendations for Other Services       Precautions / Restrictions Precautions Precautions: Fall Restrictions Weight Bearing Restrictions: No      Mobility  Bed Mobility Overal bed mobility: Needs Assistance;+2 for physical assistance Bed Mobility: Rolling Rolling: Mod assist;Total  assist;+2 for physical assistance         General bed mobility comments: Pt rolled to right with mod assist with rail with good initiation of movement since he could grab rail.  unable to roll to left without total assist and use of pad.  Nursing assisted PT with turning pt on his side to decrease pressure on bottom. Positioned so that pt could try to get a passive stretch on right residual limb and elevated left UE due to edema in fingers.    Transfers                 General transfer comment: unable  Ambulation/Gait                Stairs            Wheelchair Mobility    Modified Rankin (Stroke Patients Only)       Balance                                             Pertinent Vitals/Pain Pain Assessment: Faces Faces Pain Scale: Hurts whole lot (Simultaneous filing. User may not have seen previous data.) Pain Location: generalized but worse to touch left residual limb  Pain Descriptors / Indicators: Aching;Grimacing;Guarding;Operative site guarding;Throbbing Pain Intervention(s): Limited activity within patient's tolerance;Monitored during session;Repositioned  VSS    Home Living Family/patient expects to be discharged to:: Skilled nursing facility Living Arrangements: Children Available Help at Discharge: Family;Available 24 hours/day Type of Home: House Home Access: Level entry  Home Layout: One level Home Equipment: Wheelchair - manual;Bedside commode Additional Comments: Wore Depends most of time recently per daughter.     Prior Function Level of Independence: Needs assistance   Gait / Transfers Assistance Needed: min asssist to get in chair with daughter's assist vs. independent to get into wheelchair at times per daughter report.  ADL's / Homemaking Assistance Needed: Daughter assisted with bathing (either a sponge bath in bed or getting down in tub once a week to soak per daughter) and dressing, pt self fed and  toileted in standing with and without assist.    Comments: [facility].  Had had a recent admission and gone to [facility] for Rehab as it had began to be harder to take care of pt but pt was only there 24 hours and was readmitted to hospital.       Hand Dominance   Dominant Hand: Left    Extremity/Trunk Assessment   Upper Extremity Assessment: RUE deficits/detail;LUE deficits/detail RUE Deficits / Details: flexion contracture significant     LUE Deficits / Details: edema in fingers with pt c/o pain.  Elevated this extremity with strength shoulder and elbow 3+/5   Lower Extremity Assessment: RLE deficits/detail;LLE deficits/detail RLE Deficits / Details: Appears to be a flexion contracture however difficult to assess as pt c/o pain with stretching.  Could not stretch to neutral.   LLE Deficits / Details: Pt keeps residual limb flexed and unable to move due to pain.      Communication   Communication: Expressive difficulties  Cognition Arousal/Alertness: Awake/alert Behavior During Therapy: Flat affect Overall Cognitive Status: History of cognitive impairments - at baseline (Simultaneous filing. User may not have seen previous data.)       Memory: Decreased short-term memory;Decreased recall of precautions              General Comments General comments (skin integrity, edema, etc.): Edema left residual limb and left UE.  Called daughter to get accurate infor about PTA status.    Exercises Amputee Exercises Gluteal Sets: AAROM;Both;5 reps;Supine Hip Extension: AAROM;PROM;Both;5 reps;Supine      Assessment/Plan    PT Assessment Patient needs continued PT services  PT Diagnosis Generalized weakness;Acute pain   PT Problem List Decreased activity tolerance;Decreased balance;Decreased strength;Decreased range of motion;Decreased mobility;Decreased knowledge of use of DME;Decreased safety awareness;Decreased knowledge of precautions;Pain;Decreased skin  integrity  PT Treatment Interventions DME instruction;Functional mobility training;Therapeutic activities;Therapeutic exercise;Balance training;Patient/family education   PT Goals (Current goals can be found in the Care Plan section) Acute Rehab PT Goals Patient Stated Goal: unable to assess PT Goal Formulation: Patient unable to participate in goal setting Time For Goal Achievement: 11/30/15 Potential to Achieve Goals: Good    Frequency Min 2X/week   Barriers to discharge Decreased caregiver support daughter can asssit but pt needs to be at min assist level.    Co-evaluation               End of Session   Activity Tolerance: Patient limited by fatigue;Patient limited by pain Patient left: in bed;with call bell/phone within reach;with nursing/sitter in room Nurse Communication: Mobility status;Need for lift equipment         Time: 1143-1207 PT Time Calculation (min) (ACUTE ONLY): 24 min   Charges:   PT Evaluation $PT Eval Moderate Complexity: 1 Procedure PT Treatments $Therapeutic Activity: 8-22 mins   PT G Codes:        Renesme Kerrigan F 11/22/2015, 12:22 PM Delmore Sear,PT Acute Rehabilitation  [phone] (pager)

## 2015-11-16 NOTE — Discharge Summary (Signed)
[facility] Discharge Summary  Kylie Gros 1932-12-18 82 y.o. male  [id number]  Admission Date: 11/15/2015  Discharge Date: 11/16/2015  Physician: Conrad Thendara, MD  Admission Diagnosis: Peripheral vascular disease with gangrene left leg  HPI:   This is a 82 y.o. male who presented for evaluation of a left 2nd toe ucler. 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 The patient recently under angiography with Dr. Trula Slade who felt he was not a candidate intra-procedure for endovascular revascularization. The patient's L foot wounds have: clean up some per the patient. Conversation with patient is limited due to the patient's dementia.He was seen in the pre-op clinic on 11/12/15 where he became hypotensive and was transferred to the [facility] ED. He was fluid resuscitated. He was admitted by the Internal Medicine Teaching Service for further evaluation. He was found to have a UTI and treated with ciprofloxacin for three days. His hypotension was resolved with volume resuscitation. He was discharged back to his SNF the following day.   Hospital Course:  The patient was admitted to the hospital and taken to the operating room on 11/15/2015 and underwent: left above-the-knee amputation.     The patient tolerated the procedure well and was transported to the PACU in good condition.   The patient pulled off his dressing overnight. His incision was inspected on POD 1 and appeared to be healing well. He had some peri-operative hypotension. He was given 1L of NS and his blood pressure improved. He was discharged back to his SNF on POD 1 in good condition.     CBC    Component Value Date/Time   WBC 19.8* 11/16/2015 0409   RBC 3.25* 11/16/2015 0409   HGB 9.8* 11/16/2015 0409   HCT 29.3* 11/16/2015 0409   PLT 189 11/16/2015 0409   MCV 90.2 11/16/2015 0409   MCH 30.2 11/16/2015 0409   MCHC 33.4 11/16/2015 0409   RDW 15.4 11/16/2015 0409   LYMPHSABS 1.7 02/02/2013 1530   MONOABS 0.8 02/02/2013 1530   EOSABS 2.7* 02/02/2013 1530   BASOSABS 0.1 02/02/2013 1530    BMET    Component Value Date/Time   NA 134* 11/16/2015 0409   K 4.0 11/16/2015 0409   CL 103 11/16/2015 0409   CO2 23 11/16/2015 0409   GLUCOSE 134* 11/16/2015 0409   BUN 10 11/16/2015 0409   CREATININE 0.76 11/16/2015 0409   CALCIUM 8.3* 11/16/2015 0409   GFRNONAA >60 11/16/2015 0409   GFRAA >60 11/16/2015 0409     Discharge Instructions:   The patient is discharged to SNF with extensive instructions on wound care and progressive ambulation.  They are instructed not to drive or perform any heavy lifting until returning to see the physician in his office.    Discharge Diagnosis:  Peripheral vascular disease with gangrene left leg  Secondary Diagnosis: Patient Active Problem List   Diagnosis Date Noted  . Extremity atherosclerosis with gangrene ([place]) 11/15/2015  . S/P AKA (above knee amputation) bilateral ([place]) 11/15/2015  . Pressure ulcer 11/12/2015  . 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 Late effects of CVA (cerebrovascular accident) 04/20/2013  . Essential hypertension, benign 04/07/2013  . 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 Fall at home 09/21/2012  . Pulmonary embolism ([place]) 09/21/2012  . Lung cancer ([place]) 08/29/2012  . Vascular dementia, uncomplicated    Past Medical History  Diagnosis Date  . Hypertension   . Stroke ([place])   .  Alzheimer disease   . Cancer ([place])     Lung  . Headache   . Moderate protein-calorie malnutrition ([place])        Medication List    TAKE these medications        aspirin 81 MG tablet  Take 81 mg by mouth daily. Reported on 09/15/2015     donepezil 10 MG tablet  Commonly known as:  ARICEPT  Take 10 mg by mouth at bedtime. Reported on 09/15/2015     ENSURE PLUS Liqd  Take 237 mLs by mouth 2 (two) times daily between meals.     eucerin cream  Apply 1 application  topically 2 (two) times daily.     lisinopril 20 MG tablet  Commonly known as:  PRINIVIL,ZESTRIL  Take 1 tablet (20 mg total) by mouth daily.     memantine 10 MG tablet  Commonly known as:  NAMENDA  Take 10 mg by mouth 2 (two) times daily.     nystatin cream  Commonly known as:  MYCOSTATIN  Apply 1 application topically 3 (three) times daily as needed for dry skin.     oxyCODONE-acetaminophen 5-325 MG tablet  Commonly known as:  PERCOCET/ROXICET  Take 1-2 tablets by mouth every 6 (six) hours as needed for moderate pain.     polyethylene glycol packet  Commonly known as:  MIRALAX / GLYCOLAX  Take 17 g by mouth daily as needed for mild constipation.     traZODone 50 MG tablet  Commonly known as:  DESYREL  Take 50 mg by mouth at bedtime.     VITAMIN D-3 PO  Take 50,000 Units by mouth every 30 (thirty) days. Reported on 09/15/2015        Percocet #30 No Refill  Disposition: SNF  Patient's condition: is Good  Follow up: 1. Dr. Bridgett Larsson in 4 weeks   Virgina Jock, PA-C [facility] [phone] 11/16/2015  11:47 AM    Addendum  I have independently interviewed and examined the patient, and I agree with the physician assistant's discharge summary.  Viable R AKA.  Follow up in the office in 4 weeks for staple removal.   [name], MD [facility] [place] Office: [phone] Pager: [phone]  11/16/2015, 12:06 PM

## 2015-11-16 NOTE — Progress Notes (Signed)
Occupational Therapy Evaluation Patient Details Name: William Walters MRN: [id number] DOB: 1932-11-09 Today's Date: 11/16/2015    History of Present Illness This is a 82 y.o. male who presented for evaluation of a left 2nd toe ucler. 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 The patient recently under angiography with Dr. Trula Slade who felt he was not a candidate intra-procedure for endovascular revascularization. The patient's L foot wounds have: clean up some per the patient. Conversation with patient is limited due to the patient's dementia.He was seen in the pre-op clinic on 11/12/15 where he became hypotensive and was transferred to the [facility] ED. He was fluid resuscitated. He was admitted by the Internal Medicine Teaching Service for further evaluation. He was found to have a UTI and treated with ciprofloxacin for three days. His hypotension was resolved with volume resuscitation. He was discharged back to his SNF the following day. Underwent left AKA on 11/15/15.     Clinical Impression   Pt demonstrates significant functional change in status from baseline per chart. Pt currently total A for ADL and will require lift equipment for OOB mobility at this time. Pt will need rehab at SNF. Recommend use of palm guard for R hand to improve skin integrity from hand contracture. All further OT to be addressed at SNF.     Follow Up Recommendations  SNF;Supervision/Assistance - 24 hour    Equipment Recommendations  Other (comment) (TBA at SNF)    Recommendations for Other Services       Precautions / Restrictions Precautions Precautions: Fall Restrictions Weight Bearing Restrictions: No      Mobility Bed Mobility Overal bed mobility: Needs Assistance;+2 for physical assistance Bed Mobility: Rolling Rolling: Total assist         General bed mobility comments: total A. Once hand placed on rail, pt attmepted to  assist  Transfers                 General transfer comment: unable    Balance                                            ADL Overall ADL's : Needs assistance/impaired                                       General ADL Comments: total A for all ADL. able to wipe mouth with hand over hand for initiation  Max A for feeding - pt apparently previously able to feed himself     Vision Additional Comments: unsure of baseline vision   Perception     Praxis      Pertinent Vitals/Pain Pain Assessment: Faces Faces Pain Scale: Hurts whole lot (Simultaneous filing. User may not have seen previous data.) Pain Location: generalized  Pain Descriptors / Indicators: Grimacing Pain Intervention(s): Limited activity within patient's tolerance     Hand Dominance Left   Extremity/Trunk Assessment Upper Extremity Assessment Upper Extremity Assessment: RUE deficits/detail RUE Deficits / Details: Flexion contracture LUE Deficits / Details: edema in fingers with pt c/o pain.  Elevated this extremity with strength shoulder and elbow 3+/5   Lower Extremity Assessment Lower Extremity Assessment: Defer to PT evaluation RLE Deficits / Details: Appears to be a flexion contracture however difficult to  assess as pt c/o pain with stretching.  Could not stretch to neutral.   RLE: Unable to fully assess due to immobilization LLE Deficits / Details: Pt keeps residual limb flexed and unable to move due to pain.  LLE: Unable to fully assess due to pain   Cervical / Trunk Assessment Cervical / Trunk Assessment: Kyphotic;Other exceptions (R trunk shortening)   Communication Communication Communication: Expressive difficulties   Cognition Arousal/Alertness: Awake/alert Behavior During Therapy: Flat affect Overall Cognitive Status: History of cognitive impairments - at baseline (Simultaneous filing. User may not have seen previous data.)       Memory: Decreased  short-term memory;Decreased recall of precautions             General Comments       Exercises Exercises: Amputee     Shoulder Instructions      Home Living Family/patient expects to be discharged to:: Skilled nursing facility Living Arrangements: Children Available Help at Discharge: Family;Available 24 hours/day Type of Home: House Home Access: Level entry     Home Layout: One level     Bathroom Shower/Tub: Teacher, early years/pre: Standard     Home Equipment: Wheelchair - manual;Bedside commode   Additional Comments: Wore Depends most of time recently per daughter.       Prior Functioning/Environment Level of Independence: Needs assistance  Gait / Transfers Assistance Needed: min asssist to get in chair with daughter's assist vs. independent to get into wheelchair at times per daughter report. ADL's / Homemaking Assistance Needed: Daughter assisted with bathing (either a sponge bath in bed or getting down in tub once a week to soak per daughter) and dressing, pt self fed and toileted in standing with and without assist.     Comments: [facility].  Had had a recent admission and gone to [facility] for Rehab as it had began to be harder to take care of pt but pt was only there 24 hours and was readmitted to hospital.      OT Diagnosis: Generalized weakness;Cognitive deficits;Acute pain   OT Problem List: Decreased strength;Decreased range of motion;Decreased activity tolerance;Impaired balance (sitting and/or standing);Decreased coordination;Decreased cognition;Decreased safety awareness;Decreased knowledge of use of DME or AE;Decreased knowledge of precautions;Impaired tone;Impaired UE functional use;Increased edema;Pain   OT Treatment/Interventions:      OT Goals(Current goals can be found in the care plan section) Acute Rehab OT Goals Patient Stated Goal: unable to stae OT Goal Formulation: Patient unable to participate in goal setting   OT Frequency:     Barriers to D/C:            Co-evaluation              End of Session Nurse Communication: Mobility status  Activity Tolerance: Patient tolerated treatment well Patient left: in bed;with call bell/phone within reach   Time: 1209-1222 OT Time Calculation (min): 13 min Charges:  OT General Charges $OT Visit: 1 Procedure OT Evaluation $OT Eval Low Complexity: 1 Procedure G-Codes:    Wood Novacek,HILLARY 2015/12/11, 12:25 PM   Maurie Boettcher, OTR/L  [phone] December 11, 2015

## 2015-11-16 NOTE — Care Management Note (Signed)
Case Management Note Marvetta Gibbons RN, BSN Unit 2W-Case Manager [phone]  Patient Details  Name: William Walters MRN: [id number] Date of Birth: 07-24-1932  Subjective/Objective:   Pt admitted s/p AKA                 Action/Plan: PTA pt lived at home with daughter, is with [facility], plan is for SNF placement - CSW following for placement needs- [place]- awaiting PT/OT evals  Expected Discharge Date:                  Expected Discharge Plan:  Skilled Nursing Facility  In-House Referral:  Clinical Social Work  Discharge planning Services  CM Consult  Post Acute Care Choice:    Choice offered to:     DME Arranged:    DME Agency:     HH Arranged:    [place] Agency:     Status of Service:  In process, will continue to follow  If discussed at Long Length of Stay Meetings, dates discussed:    Additional Comments:  Dawayne Patricia, RN 11/16/2015, 11:36 AM

## 2015-11-16 NOTE — Progress Notes (Signed)
Report called to Willette Cluster, Therapist, sports at [facility]. Transport scheduled to arrive around 1500.  MD still to come by and sign DNR prior to discharge.

## 2015-11-16 NOTE — Progress Notes (Addendum)
  [facility] Progress Note  Subjective  - POD #1  Sleepy this am.   Objective Filed Vitals:   11/15/15 2217 11/16/15 0515  BP: 130/98 92/44  Pulse: 105 95  Temp: 99.6 F (37.6 C) 100 F (37.8 C)  Resp: 18 20    Intake/Output Summary (Last 24 hours) at 11/16/15 0804 Last data filed at 11/15/15 2216  Gross per 24 hour  Intake    700 ml  Output   1050 ml  Net   -350 ml   Left AKA dressing off. Stump incision is clean and dry. Mild erythema to posterior stump.   Assessment/Planning: 82 y.o. male is s/p: left AKA 1 Day Post-Op   Dressing was off this am. Patient kept pulling off. Retention stocking ordered.  Incision clean.  Crum for d/c to SNF from vascular standpoint.  F/u in 4 weeks.   Alvia Grove 11/16/2015 8:04 AM --  Laboratory CBC    Component Value Date/Time   WBC 19.8* 11/16/2015 0409   HGB 9.8* 11/16/2015 0409   HCT 29.3* 11/16/2015 0409   PLT 189 11/16/2015 0409    BMET    Component Value Date/Time   NA 134* 11/16/2015 0409   K 4.0 11/16/2015 0409   CL 103 11/16/2015 0409   CO2 23 11/16/2015 0409   GLUCOSE 134* 11/16/2015 0409   BUN 10 11/16/2015 0409   CREATININE 0.76 11/16/2015 0409   CALCIUM 8.3* 11/16/2015 0409   GFRNONAA >60 11/16/2015 0409   GFRAA >60 11/16/2015 0409    COAG Lab Results  Component Value Date   INR 2.09* 09/20/2012   INR 0.93 08/28/2012   INR 1.00 07/31/2012   No results found for: PTT  Antibiotics Anti-infectives    Start     Dose/Rate Route Frequency Ordered Stop   11/15/15 1130  cefUROXime (ZINACEF) 1.5 g in dextrose 5 % 50 mL IVPB     1.5 g 100 mL/hr over 30 Minutes Intravenous Every 12 hours 11/15/15 1037 11/15/15 2329       Virgina Jock, PA-C [facility] Office: [phone] Pager: [phone] 11/16/2015 8:04 AM  Addendum  I have independently interviewed and examined the patient, and I agree with the physician assistant's findings.  Viable R AKA  stump without active bleeding.  Pt with chronic dementia.  Dsg removed by pt.  Ok to tsfr back to SNF.  Stump sock to be ordered.  Staples out in 4 weeks.  [name], MD [facility] [place] Office: [phone] Pager: [phone]  11/16/2015, 8:37 AM

## 2015-11-17 ENCOUNTER — Telehealth: Payer: Self-pay | Admitting: Vascular Surgery

## 2015-11-17 NOTE — Telephone Encounter (Signed)
Sched appt 8/18 at 1:30. Spoke to pt's daughter to inform pt.

## 2015-11-17 NOTE — Clinical Social Work Note (Signed)
CSW was unable to complete full Psychosocial due to same day discharge. CSW called patient's daughter. She reported she is the patient's full time caregiver and the patient is apart of [facility]. She reported [facility] would be her preferred SNF. CSW called [facility] and spoke with [employer], Education officer, museum. She reported patient was with [facility] in past. CSW called [facility] admission they were willing to accept patient. CSW called patient's daughter and informed her per MD patient is ready to discharge to [facility]. Patient's daughter was happy with discharge plan.  [employer], LCSW [phone]

## 2015-11-17 NOTE — Telephone Encounter (Signed)
-----   Message from Mena Goes, RN sent at 11/16/2015  9:28 AM EDT ----- Regarding: 4 weeks AKA    ----- Message -----    From: Alvia Grove, PA-C    Sent: 11/16/2015   8:19 AM      To: Vvs Charge Pool  S/p left AKA 11/15/15  F/u with Dr. Bridgett Larsson in 4 weeks.   Thanks Maudie Mercury

## 2015-11-17 NOTE — Clinical Social Work Placement (Signed)
   CLINICAL SOCIAL WORK PLACEMENT  NOTE  Date:  11/17/2015  Patient Details  Name: William Walters MRN: [id number] Date of Birth: 1932/11/07  Clinical Social Work is seeking post-discharge placement for this patient at the [place] level of care (*CSW will initial, date and re-position this form in  chart as items are completed):  Yes   Patient/family provided with [facility] Work Department's list of facilities offering this level of care within the geographic area requested by the patient (or if unable, by the patient's family).  Yes   Patient/family informed of their freedom to choose among providers that offer the needed level of care, that participate in Medicare, Medicaid or managed care program needed by the patient, have an available bed and are willing to accept the patient.  Yes   Patient/family informed of [facility]'s ownership interest in [facility] and [facility], as well as of the fact that they are under no obligation to receive care at these facilities.  PASRR submitted to EDS on       PASRR number received on       Existing PASRR number confirmed on 11/16/15     FL2 transmitted to all facilities in geographic area requested by pt/family on 11/16/15     FL2 transmitted to all facilities within larger geographic area on       Patient informed that his/her managed care company has contracts with or will negotiate with certain facilities, including the following:        Yes   Patient/family informed of bed offers received.  Patient chooses bed at [facility]     Physician recommends and patient chooses bed at      Patient to be transferred to [facility] on 11/16/15.  Patient to be transferred to facility by Ambulance     Patient family notified on 11/16/15 of transfer.  Name of family member notified:  Darrol Jump     PHYSICIAN Please prepare priority discharge summary, including  medications, Please sign FL2, Please prepare prescriptions, Please sign DNR     Additional Comment:  Per MD patient is ready to discharge to [facility]. RN, patient, patient's family, and facility notified of discharge. RN given phone number for report and transport packet is on patient's chart. Ambulance transport requested. CSW signing off.   _______________________________________________ Samule Dry, LCSW 11/17/2015, 10:08 AM

## 2015-12-12 NOTE — Progress Notes (Signed)
    Postoperative Visit   History of Present Illness  William Walters is a 82 y.o. male who presents for postoperative follow-up for: left above-the-knee amputation (Date: 11/15/15).  The patient's wounds are healed.  The patient notes pain is well controlled.  The patient's current symptoms are: none.  For VQI Use Only  PRE-ADM LIVING: Home  AMB STATUS: Ambulatory  Physical Examination  Vitals:   12/16/15 1115  BP: 136/72  Pulse: 76  Temp: 97 F (36.1 C)   GEN: wheel chair bound  LLE: L AKA Incision is healed.  Staples are intact.  Medical Decision Making  [name] is a 82 y.o. male who presents s/p Left above-the-knee amputation, s/p prior R AKA   The patient's stump is healing appropriately with resolution of pre-operative symptoms. I discussed in depth with the patient the nature of atherosclerosis, and emphasized the importance of maximal medical management including strict control of blood pressure, blood glucose, and lipid levels, obtaining regular exercise, and cessation of smoking.  The patient is aware that without maximal medical management the underlying atherosclerotic disease process will progress, possibly leading to a more proximal amputation. The patient agrees to participate in their maximal medical care.  Thank you for allowing [place] to participate in this patient's care.  The patient has not been referred for prosthetic fitting due to his dementia likely limiting his ability to utilize a prosthesis.  The patient can follow up with [place] as needed.  [name], MD, FACS [facility] [place] Office: [phone] Pager: [phone]

## 2015-12-13 ENCOUNTER — Encounter: Payer: Self-pay | Admitting: Vascular Surgery

## 2015-12-16 ENCOUNTER — Ambulatory Visit (INDEPENDENT_AMBULATORY_CARE_PROVIDER_SITE_OTHER): Payer: Self-pay | Admitting: Vascular Surgery

## 2015-12-16 ENCOUNTER — Encounter: Payer: Self-pay | Admitting: Vascular Surgery

## 2015-12-16 VITALS — BP 136/72 | HR 76 | Temp 97.0°F | Ht 73.0 in | Wt 130.0 lb

## 2015-12-16 DIAGNOSIS — Z89611 Acquired absence of right leg above knee: Secondary | ICD-10-CM

## 2015-12-16 DIAGNOSIS — Z89612 Acquired absence of left leg above knee: Secondary | ICD-10-CM

## 2016-01-03 ENCOUNTER — Observation Stay (HOSPITAL_COMMUNITY)
Admission: EM | Admit: 2016-01-03 | Discharge: 2016-01-05 | Disposition: A | Discharge status: Home / Self Care | Payer: Medicare (Managed Care) | Attending: Internal Medicine | Admitting: Internal Medicine

## 2016-01-03 ENCOUNTER — Encounter (HOSPITAL_COMMUNITY): Payer: Self-pay | Admitting: *Deleted

## 2016-01-03 ENCOUNTER — Emergency Department (HOSPITAL_COMMUNITY): Payer: Medicare (Managed Care)

## 2016-01-03 DIAGNOSIS — L03012 Cellulitis of left finger: Secondary | ICD-10-CM | POA: Diagnosis not present

## 2016-01-03 DIAGNOSIS — F0150 Vascular dementia without behavioral disturbance: Secondary | ICD-10-CM | POA: Diagnosis present

## 2016-01-03 DIAGNOSIS — H109 Unspecified conjunctivitis: Secondary | ICD-10-CM | POA: Diagnosis not present

## 2016-01-03 DIAGNOSIS — N390 Urinary tract infection, site not specified: Secondary | ICD-10-CM | POA: Diagnosis not present

## 2016-01-03 DIAGNOSIS — G309 Alzheimer's disease, unspecified: Secondary | ICD-10-CM | POA: Insufficient documentation

## 2016-01-03 DIAGNOSIS — Z87891 Personal history of nicotine dependence: Secondary | ICD-10-CM | POA: Diagnosis not present

## 2016-01-03 DIAGNOSIS — Z89611 Acquired absence of right leg above knee: Secondary | ICD-10-CM | POA: Diagnosis not present

## 2016-01-03 DIAGNOSIS — Z89612 Acquired absence of left leg above knee: Secondary | ICD-10-CM | POA: Insufficient documentation

## 2016-01-03 DIAGNOSIS — Z85118 Personal history of other malignant neoplasm of bronchus and lung: Secondary | ICD-10-CM | POA: Insufficient documentation

## 2016-01-03 DIAGNOSIS — Z86711 Personal history of pulmonary embolism: Secondary | ICD-10-CM | POA: Insufficient documentation

## 2016-01-03 DIAGNOSIS — Z79899 Other long term (current) drug therapy: Secondary | ICD-10-CM | POA: Diagnosis not present

## 2016-01-03 DIAGNOSIS — M868X4 Other osteomyelitis, hand: Secondary | ICD-10-CM | POA: Diagnosis not present

## 2016-01-03 DIAGNOSIS — G4700 Insomnia, unspecified: Secondary | ICD-10-CM | POA: Diagnosis not present

## 2016-01-03 DIAGNOSIS — I10 Essential (primary) hypertension: Secondary | ICD-10-CM | POA: Diagnosis present

## 2016-01-03 DIAGNOSIS — K5900 Constipation, unspecified: Secondary | ICD-10-CM | POA: Diagnosis not present

## 2016-01-03 DIAGNOSIS — Z66 Do not resuscitate: Secondary | ICD-10-CM | POA: Insufficient documentation

## 2016-01-03 DIAGNOSIS — I69320 Aphasia following cerebral infarction: Secondary | ICD-10-CM | POA: Diagnosis not present

## 2016-01-03 DIAGNOSIS — I739 Peripheral vascular disease, unspecified: Secondary | ICD-10-CM | POA: Insufficient documentation

## 2016-01-03 DIAGNOSIS — Z7982 Long term (current) use of aspirin: Secondary | ICD-10-CM | POA: Insufficient documentation

## 2016-01-03 DIAGNOSIS — M869 Osteomyelitis, unspecified: Secondary | ICD-10-CM | POA: Diagnosis present

## 2016-01-03 LAB — CBC WITH DIFFERENTIAL/PLATELET
BASOS ABS: 0 10*3/uL (ref 0.0–0.1)
BASOS PCT: 0 %
Eosinophils Absolute: 0.2 10*3/uL (ref 0.0–0.7)
Eosinophils Relative: 2 %
HEMATOCRIT: 38.3 % — AB (ref 39.0–52.0)
HEMOGLOBIN: 12.2 g/dL — AB (ref 13.0–17.0)
Lymphocytes Relative: 29 %
Lymphs Abs: 2.8 10*3/uL (ref 0.7–4.0)
MCH: 29.5 pg (ref 26.0–34.0)
MCHC: 31.9 g/dL (ref 30.0–36.0)
MCV: 92.7 fL (ref 78.0–100.0)
Monocytes Absolute: 0.9 10*3/uL (ref 0.1–1.0)
Monocytes Relative: 9 %
NEUTROS ABS: 5.8 10*3/uL (ref 1.7–7.7)
NEUTROS PCT: 60 %
Platelets: 253 10*3/uL (ref 150–400)
RBC: 4.13 MIL/uL — AB (ref 4.22–5.81)
RDW: 15.3 % (ref 11.5–15.5)
WBC: 9.7 10*3/uL (ref 4.0–10.5)

## 2016-01-03 LAB — COMPREHENSIVE METABOLIC PANEL
ALK PHOS: 89 U/L (ref 38–126)
ALT: 27 U/L (ref 17–63)
ANION GAP: 7 (ref 5–15)
AST: 26 U/L (ref 15–41)
Albumin: 3.3 g/dL — ABNORMAL LOW (ref 3.5–5.0)
BILIRUBIN TOTAL: 0.3 mg/dL (ref 0.3–1.2)
BUN: 25 mg/dL — ABNORMAL HIGH (ref 6–20)
CALCIUM: 9.6 mg/dL (ref 8.9–10.3)
CO2: 27 mmol/L (ref 22–32)
Chloride: 108 mmol/L (ref 101–111)
Creatinine, Ser: 0.81 mg/dL (ref 0.61–1.24)
GFR calc non Af Amer: >60 mL/min (ref >60)
Glucose, Bld: 111 mg/dL — ABNORMAL HIGH (ref 65–99)
Potassium: 4.4 mmol/L (ref 3.5–5.1)
SODIUM: 142 mmol/L (ref 135–145)
TOTAL PROTEIN: 7.1 g/dL (ref 6.5–8.1)

## 2016-01-03 LAB — URINALYSIS, ROUTINE W REFLEX MICROSCOPIC
BILIRUBIN URINE: NEGATIVE
Glucose, UA: NEGATIVE mg/dL
Ketones, ur: NEGATIVE mg/dL
Nitrite: POSITIVE — AB
PROTEIN: 30 mg/dL — AB
SPECIFIC GRAVITY, URINE: 1.021 (ref 1.005–1.030)
pH: 7 (ref 5.0–8.0)

## 2016-01-03 LAB — URINE MICROSCOPIC-ADD ON

## 2016-01-03 MED ORDER — LIDOCAINE HCL (PF) 1 % IJ SOLN
30.0000 mL | Freq: Once | INTRAMUSCULAR | Status: AC
  Administered 2016-01-03: 30 mL
  Filled 2016-01-03: qty 30

## 2016-01-03 MED ORDER — BUPIVACAINE HCL 0.25 % IJ SOLN
20.0000 mL | Freq: Once | INTRAMUSCULAR | Status: AC
  Administered 2016-01-03: 20 mL
  Filled 2016-01-03: qty 20

## 2016-01-03 NOTE — ED Triage Notes (Signed)
Pt presents today with abscess to LT I ndex finger . X ray shows osteomylites of LT index finger.

## 2016-01-03 NOTE — ED Provider Notes (Signed)
Medications  bupivacaine (MARCAINE) 0.25 % (with pres) injection 20 mL (not administered)  lidocaine (PF) (XYLOCAINE) 1 % injection 30 mL (not administered)    Patient Vitals for the past 24 hrs:  BP Temp Temp src Pulse Resp SpO2  01/03/16 1728 128/79 98.5 F (36.9 C) Oral 92 15 95 %  01/03/16 1512 112/59 98.4 F (36.9 C) Oral 97 14 94 %       Face-to-face evaluation   History: He presents for evaluation of painful finger, left hand.  Physical exam: Ring finger, tender and swollen distally with blister, radial aspect distally. Nail appears somewhat raised. No proximal erythema or joint swelling.  Medical screening examination/treatment/procedure(s) were conducted as a shared visit with non-physician practitioner(s) and myself.  I personally evaluated the patient during the encounter    Daleen Bo, MD 01/04/16 [phone]

## 2016-01-03 NOTE — ED Provider Notes (Signed)
[facility] DEPT Provider Note   CSN: [id number] Arrival date & time: 01/03/16  1446   History   Chief Complaint Chief Complaint  Patient presents with  . Hand Pain   LEVEL V CAVEAT DUE TO DEMENTIA  HPI  William Walters is an 82 y.o. male with history of dementia, lung canger, HTN, HLD, h/o stroke, s/p bilateral AKA who presents to the ED for evaluation of left ring finger redness and swelling. Patient is unable to provide any history. He was apparently seen at [facility] clinic today with concerns for possible paronychia of his finger and was brought to the ED for evaluation by clinic staff. The staff dropped him off here and pt is unaccompanied by anyone.  Avelina Laine, PA-C of orthopedic/hand surgery was able to speak with pt's daughter [facility]) who states that pt has had redness and swelling of this finger for several weeks but has never been formally evaluated for his finger.  Past Medical History:  Diagnosis Date  . Alzheimer disease   . Cancer ([place])    Lung  . Headache   . Hypertension   . Moderate protein-calorie malnutrition ([place])   . Stroke [facility])     Patient Active Problem List   Diagnosis Date Noted  . Extremity atherosclerosis with gangrene ([place]) 11/15/2015  . S/P AKA (above knee amputation) bilateral ([place]) 11/15/2015  . Pressure ulcer 11/12/2015  . 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 Late effects of CVA (cerebrovascular accident) 04/20/2013  . Essential hypertension, benign 04/07/2013  . 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 Fall at home 09/21/2012  . Pulmonary embolism ([place]) 09/21/2012  . Lung cancer ([place]) 08/29/2012  . Vascular dementia, uncomplicated     Past Surgical History:  Procedure Laterality Date  . AMPUTATION Right 06/09/2015   Procedure: AMPUTATION ABOVE KNEE;  Surgeon: Angelia Mould, MD;  Location: [facility];  Service: Vascular;  Laterality: Right;  . AMPUTATION Left 11/15/2015   Procedure: Left leg AMPUTATION ABOVE KNEE;  Surgeon:  Conrad [place], MD;  Location: [facility];  Service: Vascular;  Laterality: Left;  . FRACTURE SURGERY    . PERIPHERAL VASCULAR CATHETERIZATION N/A 09/21/2015   Procedure: Lower Extremity Angiography - Left Leg;  Surgeon: Serafina Mitchell, MD;  Location: [facility] CV LAB;  Service: Cardiovascular;  Laterality: N/A;       Home Medications    Prior to Admission medications   Medication Sig Start Date End Date Taking? Authorizing Provider  acetaminophen (TYLENOL) 325 MG tablet Take 650 mg by mouth every 6 (six) hours as needed.    Historical Provider, MD  aspirin 81 MG tablet Take 81 mg by mouth daily. Reported on 09/15/2015    Historical Provider, MD  Cholecalciferol (VITAMIN D-3 PO) Take 50,000 Units by mouth every 30 (thirty) days. Reported on 09/15/2015    Historical Provider, MD  donepezil (ARICEPT) 10 MG tablet Take 10 mg by mouth at bedtime. Reported on 09/15/2015    Historical Provider, MD  ENSURE PLUS (ENSURE PLUS) LIQD Take 237 mLs by mouth 2 (two) times daily between meals.    Historical Provider, MD  lisinopril (PRINIVIL,ZESTRIL) 20 MG tablet Take 1 tablet (20 mg total) by mouth daily. 11/17/15   Alvia Grove, PA-C  memantine (NAMENDA) 10 MG tablet Take 10 mg by mouth 2 (two) times daily.    Historical Provider, MD  nystatin cream (MYCOSTATIN) Apply 1 application topically 3 (three) times daily as needed for dry skin.     Historical Provider, MD  oxyCODONE-acetaminophen (PERCOCET/ROXICET) 5-325 MG tablet Take 1-2 tablets by mouth every 6 (six) hours as needed for moderate pain. 11/16/15   Alvia Grove, PA-C  polyethylene glycol (MIRALAX / GLYCOLAX) packet Take 17 g by mouth daily as needed for mild constipation.    Historical Provider, MD  Skin Protectants, Misc. (EUCERIN) cream Apply 1 application topically 2 (two) times daily.    Historical Provider, MD  traZODone (DESYREL) 50 MG tablet Take 50 mg by mouth at bedtime.    Historical Provider, MD    Family History No family history on  file.  Social History Social History  Substance Use Topics  . Smoking status: Former Smoker    Types: Cigarettes  . Smokeless tobacco: Never Used  . Alcohol use No     Allergies   No known allergies   Review of Systems Review of Systems  Unable to perform ROS: Dementia     Physical Exam Updated Vital Signs BP 128/79 (BP Location: Right Arm)   Pulse 92   Temp 98.5 F (36.9 C) (Oral)   Resp 15   SpO2 95%   Physical Exam  Constitutional: No distress.  HENT:  Head: Atraumatic.  Right Ear: External ear normal.  Left Ear: External ear normal.  Nose: Nose normal.  Eyes: Conjunctivae are normal. No scleral icterus.  Cardiovascular: Normal rate and regular rhythm.   Pulmonary/Chest: Effort normal. No respiratory distress.  Abdominal: He exhibits no distension.  Musculoskeletal:  Left right finger with pocket of purulent material at lateral nail edge. Finger tip is edematous and erythematous, though soft. Erythema extends to PIP.   S/p bilateral AKA  Neurological: He is alert.  Will only respond verbally to some questions. Will follow some commands.  Skin: Skin is warm and dry. He is not diaphoretic.  Sacral pressure ulcer wound  Psychiatric: He has a normal mood and affect. His behavior is normal.  Nursing note and vitals reviewed.    ED Treatments / Results  Labs (all labs ordered are listed, but only abnormal results are displayed) Labs Reviewed  COMPREHENSIVE METABOLIC PANEL - Abnormal; Notable for the following:       Result Value   Glucose, Bld 111 (*)    BUN 25 (*)    Albumin 3.3 (*)    All other components within normal limits  CBC WITH DIFFERENTIAL/PLATELET - Abnormal; Notable for the following:    RBC 4.13 (*)    Hemoglobin 12.2 (*)    HCT 38.3 (*)    All other components within normal limits  AEROBIC/ANAEROBIC CULTURE (SURGICAL/DEEP WOUND)  URINE CULTURE  URINALYSIS, ROUTINE W REFLEX MICROSCOPIC (NOT AT [facility])    EKG  EKG  Interpretation None       Radiology Dg Finger Ring Left  Result Date: 01/03/2016 CLINICAL DATA:  Redness and swelling and fourth digit common no known injury, initial encounter EXAM: LEFT RING FINGER 2+V COMPARISON:  None. FINDINGS: There is considerable soft tissue swelling in the fourth digit with bony erosion of the distal phalanx consistent with osteomyelitis. No joint involvement is noted at this time. No acute fracture is seen. IMPRESSION: Soft tissue swelling and bony erosion in the fourth distal phalanx most consistent with osteomyelitis. Electronically Signed   By: Inez Catalina M.D.   On: 01/03/2016 16:10    Procedures Procedures (including critical care time)  Medications Ordered in ED Medications  bupivacaine (MARCAINE) 0.25 % (with pres) injection 20 mL (20 mLs Infiltration Given 01/03/16 1841)  lidocaine (PF) (XYLOCAINE) 1 %  injection 30 mL (30 mLs Infiltration Given 01/03/16 1842)     Initial Impression / Assessment and Plan / ED Course  I have reviewed the triage vital signs and the nursing notes.  Pertinent labs & imaging results that were available during my care of the patient were reviewed by me and considered in my medical decision making (see chart for details).  Clinical Course   Pt is an 82 y.o. male dropped off at the ED by [facility] staff, now unaccompanied, with osteomyelitis of his left ring finger. VSS. Afebrile rectally. Avelina Laine, PA-C spoke with Dr. Amedeo Plenty and with pt's daughter. All agree that pt will need a fingertip amputation and medical admission. Dr. Amedeo Plenty with Aaron Edelman at pt's bedside to do bedside amputation now . Please see their note for further procedural detail.  I have ordered basic labs and will call medicine for admission.  11:22 PM  I spoke with Dr. Posey Pronto of the [facility] who will come see and admit pt. Dr. Amedeo Plenty and I discussed abx choice historically vanc/zosyn but given pt age and comorbidities can defer initiating antibiotics until  medical team has evaluated pt.   Final Clinical Impressions(s) / ED Diagnoses   Final diagnoses:  Osteomyelitis of finger of left hand [facility])    New Prescriptions New Prescriptions   No medications on file     Anne Ng, Hershal Coria 01/03/16 2323    Daleen Bo, MD 01/04/16 [phone]

## 2016-01-03 NOTE — ED Triage Notes (Signed)
Per [facility] staff- pt was noted to have reddness and swelling to left ring finger. Pt was told to come here for eval by dr. Amedeo Plenty. Pt denies pain but is altered at baseline.

## 2016-01-03 NOTE — Consult Note (Signed)
Reason for Consult: left ring finger infection Referring Physician: ER staff  William Walters is an 82 y.o. male.  HPI: 82 year old male with multiple medical problems presents for evaluation of his left ring finger. This patient has an infection in his left ring finger with erosion about the bone indicative of osteomyelitis.  His chart is been reviewed.  We've been discussed with him  his extremity predicament. His lower extremity amputations are known and his history is known. We have discussed all issues with his daughter who was nice enough to talk to [place] on the phone tonight in regards to all issues.    Past Medical History:  Diagnosis Date  . Alzheimer disease   . Cancer ([place])    Lung  . Headache   . Hypertension   . Moderate protein-calorie malnutrition ([place])   . Stroke [facility])     Past Surgical History:  Procedure Laterality Date  . AMPUTATION Right 06/09/2015   Procedure: AMPUTATION ABOVE KNEE;  Surgeon: Angelia Mould, MD;  Location: [facility];  Service: Vascular;  Laterality: Right;  . AMPUTATION Left 11/15/2015   Procedure: Left leg AMPUTATION ABOVE KNEE;  Surgeon: Conrad [place], MD;  Location: [facility];  Service: Vascular;  Laterality: Left;  . FRACTURE SURGERY    . PERIPHERAL VASCULAR CATHETERIZATION N/A 09/21/2015   Procedure: Lower Extremity Angiography - Left Leg;  Surgeon: Serafina Mitchell, MD;  Location: [place] CV LAB;  Service: Cardiovascular;  Laterality: N/A;    No family history on file.  Social History:  reports that he has quit smoking. His smoking use included Cigarettes. He has never used smokeless tobacco. He reports that he does not drink alcohol or use drugs.  Allergies:  Allergies  Allergen Reactions  . No Known Allergies     Medications: I have reviewed the patient's current medications.  No results found for this or any previous visit (from the past 48 hour(s)).  Dg Finger Ring Left  Result Date: 01/03/2016 CLINICAL DATA:  Redness and  swelling and fourth digit common no known injury, initial encounter EXAM: LEFT RING FINGER 2+V COMPARISON:  None. FINDINGS: There is considerable soft tissue swelling in the fourth digit with bony erosion of the distal phalanx consistent with osteomyelitis. No joint involvement is noted at this time. No acute fracture is seen. IMPRESSION: Soft tissue swelling and bony erosion in the fourth distal phalanx most consistent with osteomyelitis. Electronically Signed   By: Inez Catalina M.D.   On: 01/03/2016 16:10    ROS Blood pressure 128/79, pulse 92, temperature 98.8 F (37.1 C), temperature source Rectal, resp. rate 15, SpO2 95 %. Physical Exam Patient has an infected left ring finger. Had this is foul-smelling necrotic at the tip.  The patient has x-rays showing a lytic lesion in the distal phalanx indicative of osteomyelitis. The distal interphalangeal joint appears to be intact without encroachment of the infectious process.  Lower extremity examination is notable for amputations  Please see additional physical per medicine. Assessment/Plan: Infected left ring finger. This is indicative of osteomyelitis.    Plan the definitive procedure tonight. We discussed with his daughter Ms William Walters the plan for surgical excision and amputation at the distal interphalangeal joint.  Please see operative note  We are planning surgery for your upper extremity. The risk and benefits of surgery to include risk of bleeding, infection, anesthesia,  damage to normal structures and failure of the surgery to accomplish its intended goals of relieving symptoms and restoring function  have been discussed in detail. With this in mind we plan to proceed. I have specifically discussed with the patient the pre-and postoperative regime and the dos and don'ts and risk and benefits in great detail. Risk and benefits of surgery also include risk of dystrophy(CRPS), chronic nerve pain, failure of the healing process to go  onto completion and other inherent risks of surgery The relavent the pathophysiology of the disease/injury process, as well as the alternatives for treatment and postoperative course of action has been discussed in great detail with the patient who desires to proceed.  We will do everything in our power to help you (the patient) restore function to the upper extremity. It is a pleasure to see this patient today.   Dict #[id number]  [name] MD  [name] 01/03/2016, 8:14 PM

## 2016-01-03 NOTE — H&P (Signed)
Date: 01/04/2016               Patient Name:  William Walters MRN: [id number]  DOB: 1932-11-29 Age / Sex: 82 y.o., male   PCP: Janifer Adie, MD         Medical Service: Internal Medicine Teaching Service         Attending Physician: Dr. Lucious Groves, DO    First Contact: Dr. Dario Guardian  Pager: [phone]  Second Contact: Dr. Julious Oka  Pager: [phone]       After Hours (After 5p/  First Contact Pager: [phone]  weekends / holidays): Second Contact Pager: [phone]   Chief Complaint: Pain in L ring finger   History of Present Illness: Patient is an 82 yo M with a pmhx of HTN, CVA, lung cancer, vascular dementia, and PVD with gangrene s/p bilateral AKAs who presents today for evaluation of his left ring finger. He was seen at [facility] clinic today with concern for infection and was dropped off at the ED by clinic staff. Orthopedic surgery was able to speak with patient's daughter on the phone who said his finger had been red and swollen for several weeks.  History is limited due to dementia and aphasia from prior stroke. History provided entirely by chart review as patient in unaccompanied and only able to answer yes no questions.   In the ED, patient's finger was found to have purulent drainage at the lateral nail edge with erythema extending to PIP joint. VSS (T 97 F, BP 136/72, HR 76, RR 14, O2 sat 95% on RA). CBC and CMP unremarkable, wbc of 9.7. Xray with soft tissue swelling and bony erosion in the fourth distal phalanx most consistent with osteomyelitis. Ortho was consulted and patient underwent amputation at the DIP. Patient tolerated the procedure well with no complications.   Meds:  Current Meds  Medication Sig  . aspirin 81 MG tablet Take 81 mg by mouth every morning. Reported on 09/15/2015  . Cholecalciferol (VITAMIN D-3 PO) Take 50,000 Units by mouth every 30 (thirty) days. Reported on 09/15/2015  . donepezil (ARICEPT) 10 MG tablet Take 10 mg by mouth at bedtime. Reported on  09/15/2015  . ENSURE PLUS (ENSURE PLUS) LIQD Take 237 mLs by mouth 2 (two) times daily between meals.  Marland Kitchen lisinopril (PRINIVIL,ZESTRIL) 20 MG tablet Take 1 tablet (20 mg total) by mouth daily.  . memantine (NAMENDA) 10 MG tablet Take 10 mg by mouth 2 (two) times daily.  Marland Kitchen nystatin cream (MYCOSTATIN) Apply 1 application topically 2 (two) times daily.   . polyethylene glycol (MIRALAX / GLYCOLAX) packet Take 17 g by mouth daily as needed for mild constipation.  . traZODone (DESYREL) 50 MG tablet Take 50 mg by mouth at bedtime.     Allergies: Allergies as of 01/03/2016 - Review Complete 01/03/2016  Allergen Reaction Noted  . No known allergies  11/14/2015   Past Medical History:  Diagnosis Date  . Alzheimer disease   . Cancer ([place])    Lung  . Headache   . Hypertension   . Moderate protein-calorie malnutrition ([place])   . Stroke [facility])     Family History: Unable to obtain due to dementia / aphasia   Social History: Unable to obtain due to dementia / aphasia   Review of Systems: A complete ROS was negative except as per HPI.  Physical Exam: Blood pressure 128/79, pulse 92, temperature 98.8 F (37.1 C), temperature source Rectal, resp. rate 15, SpO2  95 %. Physical Exam Constitutional: Elderly and cachectic. NAD, appears comfortable HEENT: Atraumatic, normocephalic. PERRL, R eye with purulent drainage, mild conjunctival injection  Neck: Supple, trachea midline.  Cardiovascular: RRR, no murmurs, rubs, or gallops.  Pulmonary/Chest: CTAB, no wheezes, rales, or rhonchi.  Abdominal: Soft, non tender, non distended. +BS.  Extremities: Bilateral AKAs with well healed incisions. Warm and well perfused.  Left 4th digit well wrapped post amputation at DIP Neurological: Alert, aphasic from prior stroke. Answers yes no questions.  Skin: No rashes or erythema  Psychiatric: Normal mood and affect  Left Ring Finger Xray:  FINDINGS: There is considerable soft tissue swelling in the fourth digit  with bony erosion of the distal phalanx consistent with osteomyelitis. No joint involvement is noted at this time. No acute fracture is seen.  IMPRESSION: Soft tissue swelling and bony erosion in the fourth distal phalanx most consistent with osteomyelitis.  Assessment & Plan by Problem: Patient is an 82 yo M who presents with osteomyelitis of his left 4th digit now s/p amputation at the DIP admitted for antibiotics.    Left 4th digit osteomyelitis: S/p surgical excision and amputation at the DIP by Dr. Amedeo Plenty.  Gram stain with gram positive cocci in pairs, cultures pending.  -- Ortho following, appreciate recs -- Ciprofloxacin 750 mg BID -- Ibuprofen prn for pain  -- Percocet prn for severe pain with hold parameters (if SBP<90, HR<65, RR<10, O2<90, or altered mental status) -- NS 100cc/hr  UTI: UA with positive nitrites, large leukocytes, 30 protein, large hgb dipstick, and many bacteria.  -- Ciprofloxacin 750 mg BID  Right eye conjunctivitis: with purulent drainage.  -- Erythromycin ophthalmic ointment 1-2 drops 4 times daily for 5 days  HTN: -- Continue home lisinopril 20 mg daily   PVD: S/p bilateral AKAs -- Continue ASA 81 daily   Vascular Dementia: -- Donepezil 10 mg QHS -- Memantine 10 mg BID  Insomnia: -- Continue trazodone 50 mg QHS   Constipation:  -- Continue home miralax daily prn   FEN: NS 100 cc/hr, replete lytes prn, soft diet VTE ppx: May start Lovenox tomorrow per ortho recs Code Status: DNR   Dispo: Admit patient to Observation with expected length of stay less than 2 midnights.  Signed: Velna Ochs, MD 01/04/2016, 12:35 AM  Pager: [phone]

## 2016-01-04 DIAGNOSIS — N390 Urinary tract infection, site not specified: Secondary | ICD-10-CM

## 2016-01-04 DIAGNOSIS — Z8739 Personal history of other diseases of the musculoskeletal system and connective tissue: Secondary | ICD-10-CM | POA: Diagnosis not present

## 2016-01-04 DIAGNOSIS — Z89612 Acquired absence of left leg above knee: Secondary | ICD-10-CM

## 2016-01-04 DIAGNOSIS — M86142 Other acute osteomyelitis, left hand: Secondary | ICD-10-CM | POA: Diagnosis not present

## 2016-01-04 DIAGNOSIS — K5900 Constipation, unspecified: Secondary | ICD-10-CM

## 2016-01-04 DIAGNOSIS — F0150 Vascular dementia without behavioral disturbance: Secondary | ICD-10-CM

## 2016-01-04 DIAGNOSIS — I10 Essential (primary) hypertension: Secondary | ICD-10-CM

## 2016-01-04 DIAGNOSIS — M869 Osteomyelitis, unspecified: Secondary | ICD-10-CM

## 2016-01-04 DIAGNOSIS — B9689 Other specified bacterial agents as the cause of diseases classified elsewhere: Secondary | ICD-10-CM | POA: Diagnosis not present

## 2016-01-04 DIAGNOSIS — Z89022 Acquired absence of left finger(s): Secondary | ICD-10-CM | POA: Diagnosis not present

## 2016-01-04 DIAGNOSIS — I69320 Aphasia following cerebral infarction: Secondary | ICD-10-CM | POA: Diagnosis not present

## 2016-01-04 DIAGNOSIS — Z89611 Acquired absence of right leg above knee: Secondary | ICD-10-CM

## 2016-01-04 DIAGNOSIS — Z79899 Other long term (current) drug therapy: Secondary | ICD-10-CM

## 2016-01-04 DIAGNOSIS — H1031 Unspecified acute conjunctivitis, right eye: Secondary | ICD-10-CM

## 2016-01-04 DIAGNOSIS — Z87891 Personal history of nicotine dependence: Secondary | ICD-10-CM

## 2016-01-04 DIAGNOSIS — Z7982 Long term (current) use of aspirin: Secondary | ICD-10-CM

## 2016-01-04 DIAGNOSIS — G4700 Insomnia, unspecified: Secondary | ICD-10-CM

## 2016-01-04 MED ORDER — ENOXAPARIN SODIUM 40 MG/0.4ML ~~LOC~~ SOLN
40.0000 mg | Freq: Every day | SUBCUTANEOUS | Status: DC
  Administered 2016-01-04 – 2016-01-05 (×2): 40 mg via SUBCUTANEOUS
  Filled 2016-01-04 (×2): qty 0.4

## 2016-01-04 MED ORDER — ACETAMINOPHEN 650 MG RE SUPP: 650.0000 mg | Freq: Four times a day (QID) | RECTAL | Status: DC | PRN

## 2016-01-04 MED ORDER — MEMANTINE HCL 10 MG PO TABS
10.0000 mg | ORAL_TABLET | Freq: Two times a day (BID) | ORAL | Status: DC
  Administered 2016-01-04 – 2016-01-05 (×4): 10 mg via ORAL
  Filled 2016-01-04 (×5): qty 1

## 2016-01-04 MED ORDER — TRAZODONE HCL 50 MG PO TABS
50.0000 mg | ORAL_TABLET | Freq: Every day | ORAL | Status: DC
  Administered 2016-01-04 (×2): 50 mg via ORAL
  Filled 2016-01-04 (×2): qty 1

## 2016-01-04 MED ORDER — ENSURE ENLIVE PO LIQD
237.0000 mL | Freq: Two times a day (BID) | ORAL | Status: DC
  Administered 2016-01-04 – 2016-01-05 (×3): 237 mL via ORAL

## 2016-01-04 MED ORDER — OXYCODONE-ACETAMINOPHEN 5-325 MG PO TABS
1.0000 | ORAL_TABLET | ORAL | Status: DC | PRN
  Administered 2016-01-04 – 2016-01-05 (×2): 1 via ORAL
  Filled 2016-01-04 (×2): qty 1

## 2016-01-04 MED ORDER — ASPIRIN 81 MG PO CHEW
81.0000 mg | CHEWABLE_TABLET | Freq: Every day | ORAL | Status: DC
  Administered 2016-01-04 – 2016-01-05 (×2): 81 mg via ORAL
  Filled 2016-01-04 (×2): qty 1

## 2016-01-04 MED ORDER — SODIUM CHLORIDE 0.9 % IV SOLN
INTRAVENOUS | Status: AC
  Administered 2016-01-04: 02:00:00 via INTRAVENOUS

## 2016-01-04 MED ORDER — POLYETHYLENE GLYCOL 3350 17 G PO PACK
17.0000 g | PACK | Freq: Every day | ORAL | Status: DC | PRN
  Administered 2016-01-05: 17 g via ORAL
  Filled 2016-01-04: qty 1

## 2016-01-04 MED ORDER — DEXTROSE 5 % IV SOLN: 2.0000 g | Freq: Every day | INTRAVENOUS | Status: DC

## 2016-01-04 MED ORDER — LISINOPRIL 20 MG PO TABS
20.0000 mg | ORAL_TABLET | Freq: Every day | ORAL | Status: DC
  Administered 2016-01-04 – 2016-01-05 (×2): 20 mg via ORAL
  Filled 2016-01-04 (×2): qty 1

## 2016-01-04 MED ORDER — CIPROFLOXACIN HCL 500 MG PO TABS
750.0000 mg | ORAL_TABLET | Freq: Two times a day (BID) | ORAL | Status: DC
  Administered 2016-01-04 – 2016-01-05 (×4): 750 mg via ORAL
  Filled 2016-01-04 (×4): qty 2

## 2016-01-04 MED ORDER — ACETAMINOPHEN 325 MG PO TABS: 650.0000 mg | ORAL_TABLET | Freq: Four times a day (QID) | ORAL | Status: DC | PRN

## 2016-01-04 MED ORDER — IBUPROFEN 200 MG PO TABS: 400.0000 mg | ORAL_TABLET | Freq: Four times a day (QID) | ORAL | Status: DC | PRN

## 2016-01-04 MED ORDER — ERYTHROMYCIN 5 MG/GM OP OINT
TOPICAL_OINTMENT | Freq: Four times a day (QID) | OPHTHALMIC | Status: DC
  Administered 2016-01-04 (×3): via OPHTHALMIC
  Administered 2016-01-05: 1 via OPHTHALMIC
  Administered 2016-01-05 (×3): via OPHTHALMIC
  Filled 2016-01-04: qty 3.5

## 2016-01-04 MED ORDER — DONEPEZIL HCL 10 MG PO TABS
10.0000 mg | ORAL_TABLET | Freq: Every day | ORAL | Status: DC
  Administered 2016-01-04 (×2): 10 mg via ORAL
  Filled 2016-01-04 (×2): qty 1

## 2016-01-04 NOTE — Progress Notes (Signed)
Pt does not have any iv abx ordered.  Clarified with Dr. Hetty Ely.  States, "Cipro is sufficient"

## 2016-01-04 NOTE — Progress Notes (Signed)
Subjective:    Patient recently pulled his IV out this morning.  He does not appear uncomfortable.    objective: Vital signs in last 24 hours: Temp:  [98.2 F (36.8 C)-98.8 F (37.1 C)] 98.2 F (36.8 C) (09/06 0240) Pulse Rate:  [79-97] 79 (09/06 0240) Resp:  [12-16] 16 (09/06 0240) BP: (105-128)/(53-79) 105/68 (09/06 0240) SpO2:  [94 %-96 %] 95 % (09/06 0240) Weight:  [46.8 kg (103 lb 1.6 oz)] 46.8 kg (103 lb 1.6 oz) (09/06 0240)  Intake/Output from previous day: 09/05 0701 - 09/06 0700 In: 520 [P.O.:120; I.V.:400] Out: 150 [Urine:150] Intake/Output this shift: No intake/output data recorded.   Recent Labs  01/03/16 1814  HGB 12.2*    Recent Labs  01/03/16 1814  WBC 9.7  RBC 4.13*  HCT 38.3*  PLT 253    Recent Labs  01/03/16 1814  NA 142  K 4.4  CL 108  CO2 27  BUN 25*  CREATININE 0.81  GLUCOSE 111*  CALCIUM 9.6   No results for input(s): LABPT, INR in the last 72 hours. Results for orders placed or performed during the hospital encounter of 01/03/16  Aerobic/Anaerobic Culture (surgical/deep wound)     Status: None (Preliminary result)   Collection Time: 01/03/16  7:40 PM  Result Value Ref Range Status   Specimen Description ABSCESS LEFT FINGER  Final   Special Requests LEFT RING FINGER  Final   Gram Stain   Final    ABUNDANT WBC PRESENT, PREDOMINANTLY PMN ABUNDANT GRAM POSITIVE COCCI IN PAIRS    Culture PENDING  Incomplete   Report Status PENDING  Incomplete   Examination of the aft upper extremity shows that his dressings are intact, skin is intact or signs of ascending cellulitis are present.    Assessment/Plan:   osteomyelitis left Ring finger distal tip status post amputation at the DIP level Patient Active Problem List   Diagnosis Date Noted  . Osteomyelitis of finger of left hand ([place]) 01/04/2016  . Extremity atherosclerosis with gangrene ([place]) 11/15/2015  . S/P AKA (above knee amputation) bilateral ([place]) 11/15/2015  . Pressure ulcer  11/12/2015  . 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 Late effects of CVA (cerebrovascular accident) 04/20/2013  . Essential hypertension, benign 04/07/2013  . 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 Fall at home 09/21/2012  . Pulmonary embolism ([place]) 09/21/2012  . Lung cancer ([place]) 08/29/2012  . Vascular dementia, uncomplicated     patient is currently on Cipro 750 mg twice a day.  We will continue to await final cultures and tailor antibiotics appropriately.  Would recommend infectious disease consult for duration of antibiotic regime upon final cultures.  We will continue to closely observe the patient. Hammond Obeirne L 01/04/2016, 8:58 AM

## 2016-01-04 NOTE — Op Note (Signed)
NAMEBIJON, MINEER NO.:  [id number]  MEDICAL RECORD NO.:  [id number]  LOCATION:  5N20C                        FACILITY:  [facility]  PHYSICIAN:  Satira Anis. Josaphine Shimamoto, M.D.DATE OF BIRTH:  [birth year]  DATE OF PROCEDURE: DATE OF DISCHARGE:                              OPERATIVE REPORT   PREOPERATIVE DIAGNOSIS:  Left ring finger chronic osteomyelitis/deep infection.  POSTOPERATIVE DIAGNOSIS:  Left ring finger chronic osteomyelitis/deep infection.  PROCEDURE: 1. Irrigation and debridement of skin, subcutaneous tissue, bone and     associated soft tissue.  This was an excisional debridement with     curette, knife, blade, and scissors. 2. Amputation of distal interphalangeal joint level with neurectomies     and flexor and extensor tenotomies, left ring finger.  SURGEON:  Satira Anis. Amedeo Plenty, M.D.  ASSISTANT:  Avelina Laine, PA-C.  COMPLICATION:  None.  ANESTHESIA:  Peripheral nerve block.  TOURNIQUET TIME:  Less than 30 minutes.  INDICATIONS:  An 82 year old male, who presents with the above-mentioned diagnosis.  I have counseled in regard to risks and benefits of surgery and he desires to proceed.  We have called his family who consented him as he has significant medical problems.  The patient demonstrates a willingness to proceed, but certainly we obtained consent from the family, daughter's name is Ms. Darrol Jump.  Following verbal consent from Ms. Hurn, we proceed to the operative theater.  The procedure suite served as our operative theater.  The patient underwent intermetacarpal block followed by 2 separate 10 minutes surgical Betadine scrub, followed by isolation of sterile field.  Surgical time-out was called.  Following this, we performed I and D of skin, subcutaneous tissue, bone, tendon, and associated deep tissue.  Cultures were taken intraoperatively for aerobic and anaerobic cultures.  This was an I and D of skin, subcutaneous tissue,  bone.  This was excisional in nature.  3 L of saline were placed with pulsatile force through the area and we completely cleaned this field.  Following this, we then performed a sharp dissection with 15 blade knife and resection of the DIP joint as well as resection of the FDP and extensor apparatus.  The patient's volar flap was carefully attended to. Wounds looked excellent.  Tourniquet deflated and all looked quite well.  Thus, DIP level amputation was accomplished.  The middle phalanx distal head appeared to have normal cartilage and without signs of an osteomyelitic process.  However, we will watch this closely.  Following this, we deflated the tourniquet.  Noted excellent bleeding and closed the wound with chromic after shaping the distal and to make sure that he had a smooth surface.  The patient tolerated the procedure well.  A sterile dressing of Adaptic, Xeroform, 4x4s gauze, Coban was applied.  Following the procedure, we called the family let know everything went fine.  The patient certainly had osteomyelitis secondary to long-standing deep infection in his finger.  The bone was completely in a way with the distal phalanx, however, the DIP joint was cleaned.  We essentially performed definitive treatment of the ostia with osteomyelitis resection, however, we will continue to maintain a close eye for further infection and would recommend antibiotics for  minimum of 2-3 weeks given all issues to prevent any secondary infectious issues. He will be admitted by the Medicine Service.  We will follow along accordingly and all questions have been encouraged and answered.     Satira Anis. Amedeo Plenty, M.D.     [facility]  D:  01/03/2016  T:  01/04/2016  Job:  [id number]

## 2016-01-04 NOTE — Progress Notes (Signed)
Subjective: [name] he is feeling bothered by the wound dressing. He denies any new complaints such as chest pain, abdominal pain or shortness of breath. He says that his eyes are not bothering him.   Objective:  Vital signs in last 24 hours: Vitals:   01/03/16 1728 01/03/16 1842 01/04/16 0149 01/04/16 0240  BP: 128/79  (!) 113/53 105/68  Pulse: 92  86 79  Resp: '15  12 16  '$ Temp: 98.5 F (36.9 C) 98.8 F (37.1 C) 98.4 F (36.9 C) 98.2 F (36.8 C)  TempSrc: Oral Rectal Oral Oral  SpO2: 95%  96% 95%  Weight:    103 lb 1.6 oz (46.8 kg)   Physical Exam  Constitutional: He appears well-developed and well-nourished. No distress.  Cardiovascular: Normal rate and regular rhythm.   No murmur heard. Pulmonary/Chest: He has no wheezes. He has no rales.  Abdominal: Soft. He exhibits no distension. There is no tenderness.  Neurological: He is alert.  Extremities: wound dressing in place over his left ring finger is clean, dry, and intact. No peripheral edema, no calf tenderness.  Labs: CBC:  Recent Labs Lab 01/03/16 1814  WBC 9.7  NEUTROABS 5.8  HGB 12.2*  HCT 38.3*  MCV 92.7  PLT 237   Metabolic Panel:  Recent Labs Lab 01/03/16 1814  NA 142  K 4.4  CL 108  CO2 27  GLUCOSE 111*  BUN 25*  CREATININE 0.81  CALCIUM 9.6  ALT 27  ALKPHOS 89  BILITOT 0.3  PROT 7.1  ALBUMIN 3.3*   Microbiology: Urine culture 9/5 in process Blood culture drawn 9/5 gram positive cocci in pairs, cultures pending   Imaging: Finger xray 01/03/2016 Soft tissue swelling and bony erosion in the fourth distal phalanx most consistent with osteomyelitis.   Medications: Infusions: . sodium chloride 100 mL/hr at 01/04/16 0229   Scheduled Medications: . aspirin  81 mg Oral Daily  . ciprofloxacin  750 mg Oral BID  . donepezil  10 mg Oral QHS  . enoxaparin (LOVENOX) injection  40 mg Subcutaneous Daily  . erythromycin   Right Eye Q6H  . feeding supplement (ENSURE ENLIVE)  237 mL  Oral BID BM  . lisinopril  20 mg Oral Daily  . memantine  10 mg Oral BID  . traZODone  50 mg Oral QHS   PRN Medications: acetaminophen **OR** acetaminophen, ibuprofen, oxyCODONE-acetaminophen, polyethylene glycol  Assessment/Plan: Pt is a 82 y.o. yo male with a PMHx of HTN, CVA, lung cancer, vascular dementia, and PVD with gangrene s/p bilateral AKAs  who was admitted on 01/03/2016 with symptoms of left ring finger swelling, which was determined to be secondary to osteomyelitis. Interventions at this time will be focused on monitoring wound healing and antibiotics.   Active Problems:   Vascular dementia, uncomplicated   Essential hypertension, benign   S/P AKA (above knee amputation) bilateral ([facility])   Osteomyelitis of finger of left hand ([facility])  Left 4th digit osteomyelitis: S/p surgical excision and amputation at the DIP by Dr. Amedeo Plenty.  Gram stain with gram positive cocci in pairs, cultures pending.  -Ortho following, appreciate recs -continue Ciprofloxacin 750 mg BID for now, will narrow based on blood culture results  -Ibuprofen prn for pain  -Percocet prn for severe pain with hold parameters (if SBP<90, HR<65, RR<10, O2<90, or altered mental status) -NS 100cc/hr  UTI: UA with positive nitrites, large leukocytes, 30 protein, large hgb dipstick, and many bacteria.  -follow up urine culture  -Ciprofloxacin 750 mg BID  for osteomyelitis will cover for any UTI organisms   Right eye conjunctivitis  - continue Erythromycin ophthalmic ointment 1-2 drops 4 times daily for 5 days (day 1/5)   HTN: Normotensive. Continue home lisinopril 20 mg daily   PVD: S/p bilateral AKAs Continue ASA 81 daily   Vascular Dementia continue home medications Donepezil 10 mg QHS and Memantine 10 mg BID  Insomnia: Continue home trazodone 50 mg QHS   Constipation: Continue home miralax daily prn   Dispo: Anticipated discharge in approximately 1-2 day(s).   LOS: 0 days   Ledell Noss, MD 01/04/2016,  11:19 AM Pager: [phone]

## 2016-01-04 NOTE — Progress Notes (Signed)
Pt admitted to 5N20. Pt's belongings include: hoyer lift sling and a bag of clothing.

## 2016-01-05 DIAGNOSIS — Z89022 Acquired absence of left finger(s): Secondary | ICD-10-CM | POA: Diagnosis not present

## 2016-01-05 DIAGNOSIS — N390 Urinary tract infection, site not specified: Secondary | ICD-10-CM | POA: Diagnosis not present

## 2016-01-05 DIAGNOSIS — B964 Proteus (mirabilis) (morganii) as the cause of diseases classified elsewhere: Secondary | ICD-10-CM

## 2016-01-05 DIAGNOSIS — M86142 Other acute osteomyelitis, left hand: Secondary | ICD-10-CM

## 2016-01-05 DIAGNOSIS — B9689 Other specified bacterial agents as the cause of diseases classified elsewhere: Secondary | ICD-10-CM | POA: Diagnosis not present

## 2016-01-05 MED ORDER — ERYTHROMYCIN 5 MG/GM OP OINT: TOPICAL_OINTMENT | Freq: Four times a day (QID) | OPHTHALMIC | 0 refills | Status: AC

## 2016-01-05 MED ORDER — OXYCODONE-ACETAMINOPHEN 5-325 MG PO TABS: 1.0000 | ORAL_TABLET | ORAL | 0 refills | Status: AC | PRN

## 2016-01-05 MED ORDER — CIPROFLOXACIN HCL 500 MG PO TABS: 500.0000 mg | ORAL_TABLET | Freq: Two times a day (BID) | ORAL | 0 refills | Status: AC

## 2016-01-05 MED ORDER — CIPROFLOXACIN HCL 500 MG PO TABS: 500.0000 mg | ORAL_TABLET | Freq: Two times a day (BID) | ORAL | 0 refills | Status: DC

## 2016-01-05 NOTE — Progress Notes (Signed)
Subjective: [name] denies any pain today. He is sitting contently in bed when seen during rounds.   Objective:  Vital signs in last 24 hours: Vitals:   01/04/16 0240 01/04/16 1435 01/04/16 2039 01/05/16 0328  BP: 105/68 (!) 169/89 (!) 169/95 (!) 148/79  Pulse: 79 87 80 82  Resp: '16 17 16 18  '$ Temp: 98.2 F (36.8 C) 97.9 F (36.6 C) 99.9 F (37.7 C) 98.7 F (37.1 C)  TempSrc: Oral Axillary Oral Oral  SpO2: 95% 99% 100% 95%  Weight: 103 lb 1.6 oz (46.8 kg)       Physical Exam  Constitutional: He appears well-developed and well-nourished. No distress.  Cardiovascular: Normal rate and regular rhythm.   No murmur heard. Pulmonary/Chest: He has no wheezes. He has no rales.  Abdominal: Soft. He exhibits no distension. There is no tenderness.  Neurological: He is alert.  Extremities:dressing clean dry and intact over left ring finger.   Labs: CBC:  Recent Labs Lab 01/03/16 1814  WBC 9.7  NEUTROABS 5.8  HGB 12.2*  HCT 38.3*  MCV 92.7  PLT 712   Metabolic Panel:  Recent Labs Lab 01/03/16 1814  NA 142  K 4.4  CL 108  CO2 27  GLUCOSE 111*  BUN 25*  CREATININE 0.81  CALCIUM 9.6  ALT 27  ALKPHOS 89  BILITOT 0.3  PROT 7.1  ALBUMIN 3.3*   Microbiology: Urine culture 9/5 in process Blood culture drawn 9/5 gram positive cocci in pairs, cultures pending   Imaging: Finger xray 01/03/2016 Soft tissue swelling and bony erosion in the fourth distal phalanx most consistent with osteomyelitis.  Medications: Infusions:   Scheduled Medications: . aspirin  81 mg Oral Daily  . ciprofloxacin  750 mg Oral BID  . donepezil  10 mg Oral QHS  . enoxaparin (LOVENOX) injection  40 mg Subcutaneous Daily  . erythromycin   Right Eye Q6H  . feeding supplement (ENSURE ENLIVE)  237 mL Oral BID BM  . lisinopril  20 mg Oral Daily  . memantine  10 mg Oral BID  . traZODone  50 mg Oral QHS   PRN Medications: acetaminophen **OR** acetaminophen, ibuprofen,  oxyCODONE-acetaminophen, polyethylene glycol  Assessment/Plan: Pt is a 82 y.o. yo male with a PMHx of HTN, CVA, lung cancer, vascular dementia, and PVD with gangrene s/p bilateral AKAs  who was admitted on 01/03/2016 with symptoms of left ring finger swelling, which was determined to be secondary to osteomyelitis. Interventions at this time will be focused on monitoring wound healing and antibiotics.   Principal Problem:   Osteomyelitis of finger of left hand ([facility]) Active Problems:   Vascular dementia, uncomplicated   Essential hypertension, benign   S/P AKA (above knee amputation) bilateral ([facility])  Left 4th digit osteomyelitis: S/p surgical excision and amputation at the DIP by Dr. Amedeo Plenty. Cultures were taken from the skin use to create the surgical skin flap. Gram stain with gram positive cocci in pairs, cultures show no growth for 24 hours on 9/7. Continue ibuprofen and percocet prn for pain. Spoke with infectious disease they recommended 2 week course of antibiotics if there is growth Doxycycline for staph vs keflex for strep -Ortho is following we appreciate their recs.  -continue Ciprofloxacin 750 mg BID for now (has received 2 days) this is treating his UTI and will be narrow and extended based on blood culture results   UTI: UA with positive nitrites, large leukocytes, and many bacteria. Urine culture grew gram negative rods.  -Ciprofloxacin  750 mg BID for osteomyelitis will cover this (day 2/7)   Right eye conjunctivitis  - continue Erythromycin ophthalmic ointment 1-2 drops 4 times daily for 5 days (day 2/5)   HTN: Normotensive. Continue home lisinopril 20 mg daily   PVD: S/p bilateral AKAs Continue ASA 81 daily   Vascular Dementia continue home medications Donepezil 10 mg QHS and Memantine 10 mg BID  Insomnia: Continue home trazodone 50 mg QHS   Constipation: Continue home miralax daily prn   Dispo: Anticipated discharge in approximately 1-2 day(s).   LOS: 0 days    Ledell Noss, MD 01/05/2016, 8:45 AM Pager: [phone]

## 2016-01-05 NOTE — Discharge Summary (Signed)
Name: William Walters MRN: [id number] DOB: 1933-01-11 82 y.o. PCP: William Adie, MD  Date of Admission: 01/03/2016  5:47 PM Date of Discharge: 01/05/2016 Attending Physician: William Groves, DO  Discharge Diagnosis: Principal Problem:   Osteomyelitis of finger of left hand William Walters)  Presented with several week history of left ring finger   Distal finger was amputated and he was started on ciprofloxacin    intraoperative cultures of the skin flap were taken and grew haemophilus and peptostreptococcus after discharge   Dr. Heber Walters contacted Dr. Jimmye Walters and discussed recommendation to switch to Augmentin  Active Problems: Urinary tract infection   Urine culture tested positive for proteus mirabilis  Conjunctivitis   On presentation he had right eye conjunctivitis with purulent drainage  Started on erythromycin eye drops    Vascular dementia, uncomplicated   Essential hypertension, benign   S/P AKA (above knee amputation) bilateral (William Walters)   Discharge Medications:   Medication List    TAKE these medications   aspirin 81 MG tablet Take 81 mg by mouth every morning. Reported on 09/15/2015   ciprofloxacin 500 MG tablet Commonly known as:  CIPRO Take 1 tablet (500 mg total) by mouth 2 (two) times daily. Stop date 9/12   donepezil 10 MG tablet Commonly known as:  ARICEPT Take 10 mg by mouth at bedtime. Reported on 09/15/2015   ENSURE PLUS Liqd Take 237 mLs by mouth 2 (two) times daily between meals.   erythromycin ophthalmic ointment Place into the right eye every 6 (six) hours.   lisinopril 20 MG tablet Commonly known as:  PRINIVIL,ZESTRIL Take 1 tablet (20 mg total) by mouth daily.   memantine 10 MG tablet Commonly known as:  NAMENDA Take 10 mg by mouth 2 (two) times daily.   nystatin cream Commonly known as:  MYCOSTATIN Apply 1 application topically 2 (two) times daily.   oxyCODONE-acetaminophen 5-325 MG tablet Commonly known as:  PERCOCET/ROXICET Take 1 tablet by  mouth every 4 (four) hours as needed for severe pain.   polyethylene glycol packet Commonly known as:  MIRALAX / GLYCOLAX Take 17 g by mouth daily as needed for mild constipation.   traZODone 50 MG tablet Commonly known as:  DESYREL Take 50 mg by mouth at bedtime.   VITAMIN D-3 PO Take 50,000 Units by mouth every 30 (thirty) days. Reported on 09/15/2015       Disposition and follow-up:   WilliamAman Walters was discharged from William Walters in Stable condition.  At the Walters follow up visit please address:  1.  Osteomyelitis of the left ring finger: How does the surgical site look? Has he scheduled a follow up visit with surgery for suture removal and evaluation?  Conjunctivitis: has this resolved?   2.  Labs / imaging needed at time of follow-up: CBC   3.  Pending labs/ test needing follow-up: none   Follow-up Appointments: Follow-up Information    William Maroon, MD .   Specialty:  Family Medicine Why:  Go to your regularly scheduled appointments 5 times per week  Contact information: William Walters [phone]           Walters Course by problem list: Principal Problem:   Osteomyelitis of finger of left hand William Walters) Active Problems:   Vascular dementia, uncomplicated   Essential hypertension, benign   S/P AKA (above knee amputation) bilateral (William Walters)   Osteomyelitis of the left ring finger  Patient with PMH of htn, vascular dementia, and peripheral  vascular disease s/p bilateral AKA presented with several week history of left ring finger erythema and swelling. Xray in the ED was consistent with osteomyelitis. Orthopedic surgery was consulted and performed amputation at the DIP and took intraoperative cultures of the skin used for surgical flap. He tolerated the procedure well and complained of some discomfort with the bandaging but denied pain. He received 2 days of ciprofloxacin 750 mg BID during this admission this was  switched to ciprofloxacin 500 mg BID for discharge. After discharge intraoperative wound cultures grew haemophilus influenza and peptostreptococcus. Dr. Heber Dundee the attending physician delivered these results to [name] primary care [facility] physician and they discussed switching antibiotic treatment to augmentin.   Urinary Tract Infection  Screening symptoms could not be assessed due to dementia and aphasia secondary to prior stroke. Urinalysis was positive for nitrites, many bacteria, large leukocytes, 30 protein and large hemoglobin dipstick. He was started on Ciprofloxacin for discharge. After discharge urine culture grew proteus mirabilis.  Conjunctivitis  Presented with right eye conjunctivitis with purulent drainage. He was started on erythromycin ophthalmic ointment for a 5 day total course.   Discharge Vitals:   BP (!) 148/79 (BP Location: Right Arm)   Pulse 82   Temp 98.7 F (37.1 C) (Oral)   Resp 18   Wt 103 lb 1.6 oz (46.8 kg)   SpO2 95%   BMI 13.60 kg/m   Pertinent Labs, Studies, and Procedures: Intraoperative left ring finger cultures 9/5: small haemophilus influenza and moderate peptostreptococcus  Urine culture collected 9/5 Proteus Mirabilis   Procedures Performed:  Dg Finger Ring Left  Result Date: 01/03/2016 CLINICAL DATA:  Redness and swelling and fourth digit common no known injury, initial encounter EXAM: LEFT RING FINGER 2+V COMPARISON:  None. FINDINGS: There is considerable soft tissue swelling in the fourth digit with bony erosion of the distal phalanx consistent with osteomyelitis. No joint involvement is noted at this time. No acute fracture is seen. IMPRESSION: Soft tissue swelling and bony erosion in the fourth distal phalanx most consistent with osteomyelitis. Electronically Signed   By: Inez Catalina M.D.   On: 01/03/2016 16:10   Consultations: Dr. Roseanne Kaufman- orthopedic surgery   Discharge Instructions: Discharge Instructions    Call MD for:   redness, tenderness, or signs of infection (pain, swelling, redness, odor or green/yellow discharge around incision site)    Complete by:  As directed   Call MD for:  severe uncontrolled pain    Complete by:  As directed   Call MD for:  temperature >100.4    Complete by:  As directed   Diet - low sodium heart healthy    Complete by:  As directed   Increase activity slowly    Complete by:  As directed      Signed: Ledell Noss, MD 01/05/2016, 3:41 PM   Pager: [phone]

## 2016-01-05 NOTE — Plan of Care (Signed)
Problem: Pain Management: Goal: General experience of comfort will improve Outcome: Progressing Resting well- not requiring much pain med

## 2016-01-05 NOTE — Discharge Planning (Signed)
Patient IV removed.  Discharge papers given, explained and educated.  Told of suggested FU appts, meds sent to [facility] and also given script for pain.  RN assessment and VS revealed stability for DC to home.  When ready, patient will be wheeled to front and family transporting home via car.

## 2016-01-05 NOTE — Progress Notes (Signed)
Patient ID: William Walters, male   DOB: November 23, 1932, 82 y.o.   MRN: [id number]  Hand and Upper Extremity Surgery  Patient to be discharged today. I have discussed discharge issues and follow up with Dr. Hetty Ely. Cultures are still pending, we would recommend Cipro 500 bid x 3 weeks given significant soft tissue involvement as well. William Walters will need to see [place] next week for a follow up and wound check. Please contact us for any questions or concerns.  Aaron Edelman L. Jenean Lindau PA-C

## 2016-01-05 NOTE — Progress Notes (Signed)
Internal Medicine Attending:   I saw and examined the patient. I reviewed the resident's note and I agree with the resident's findings and plan as documented in the resident's note.  William Walters is doing well today, he denies any pain.  Bandage is clean dry and intact. Heart RRR no appreciated murmur   Complicated UTI due to Gram Negative Rods - On day 2 of Cipro awaiting speciation will need at least 7 days of antibiotics  Osteomyelitis of Left 4th finger - Definitive treatment with surgical amputation  Cellulitis of left 4th finger not removed with amputation - Gram stain shows G+ cocci in pairs, currently on day 2 of Cipro which may not the best agent will need a 2 week course of appropriate antibiotics pending speciation.

## 2016-01-05 NOTE — Discharge Instructions (Signed)
Thank you for trusting [place] with your medical care!  You were hospitalized for osteomyelitis (bone infection), urinary tract infection, and eye infection and treated with surgery and antibiotics.   Please take note of the following changes to your medications: Start taking Ciprofloxacin take one pill tonight then twice daily until 9/26 Start using erythromycin 1-2 eye drops four times daily until 9/10 Start taking percocet as needed for pain every 4 hours   Continue your home medications   To make sure you are getting better, please make it to the follow-up appointments listed on the first page.  If you have any questions, please call [phone].

## 2016-01-06 LAB — URINE CULTURE

## 2016-01-08 LAB — AEROBIC/ANAEROBIC CULTURE (SURGICAL/DEEP WOUND)

## 2016-01-08 LAB — AEROBIC/ANAEROBIC CULTURE W GRAM STAIN (SURGICAL/DEEP WOUND)

## 2016-01-11 ENCOUNTER — Ambulatory Visit: Payer: Self-pay | Admitting: Vascular Surgery

## 2016-01-11 ENCOUNTER — Encounter (HOSPITAL_COMMUNITY): Payer: Self-pay

## 2016-04-30 DEATH — deceased

## 2017-12-19 IMAGING — CR DG TOE 2ND 2+V*L*
3 series · 3 of 3 positions shown · non-contrast
Comparison: None.

CLINICAL DATA: Sore top of left second toe.  Question gout

EXAM:
LEFT SECOND TOE

[t toes ap left]
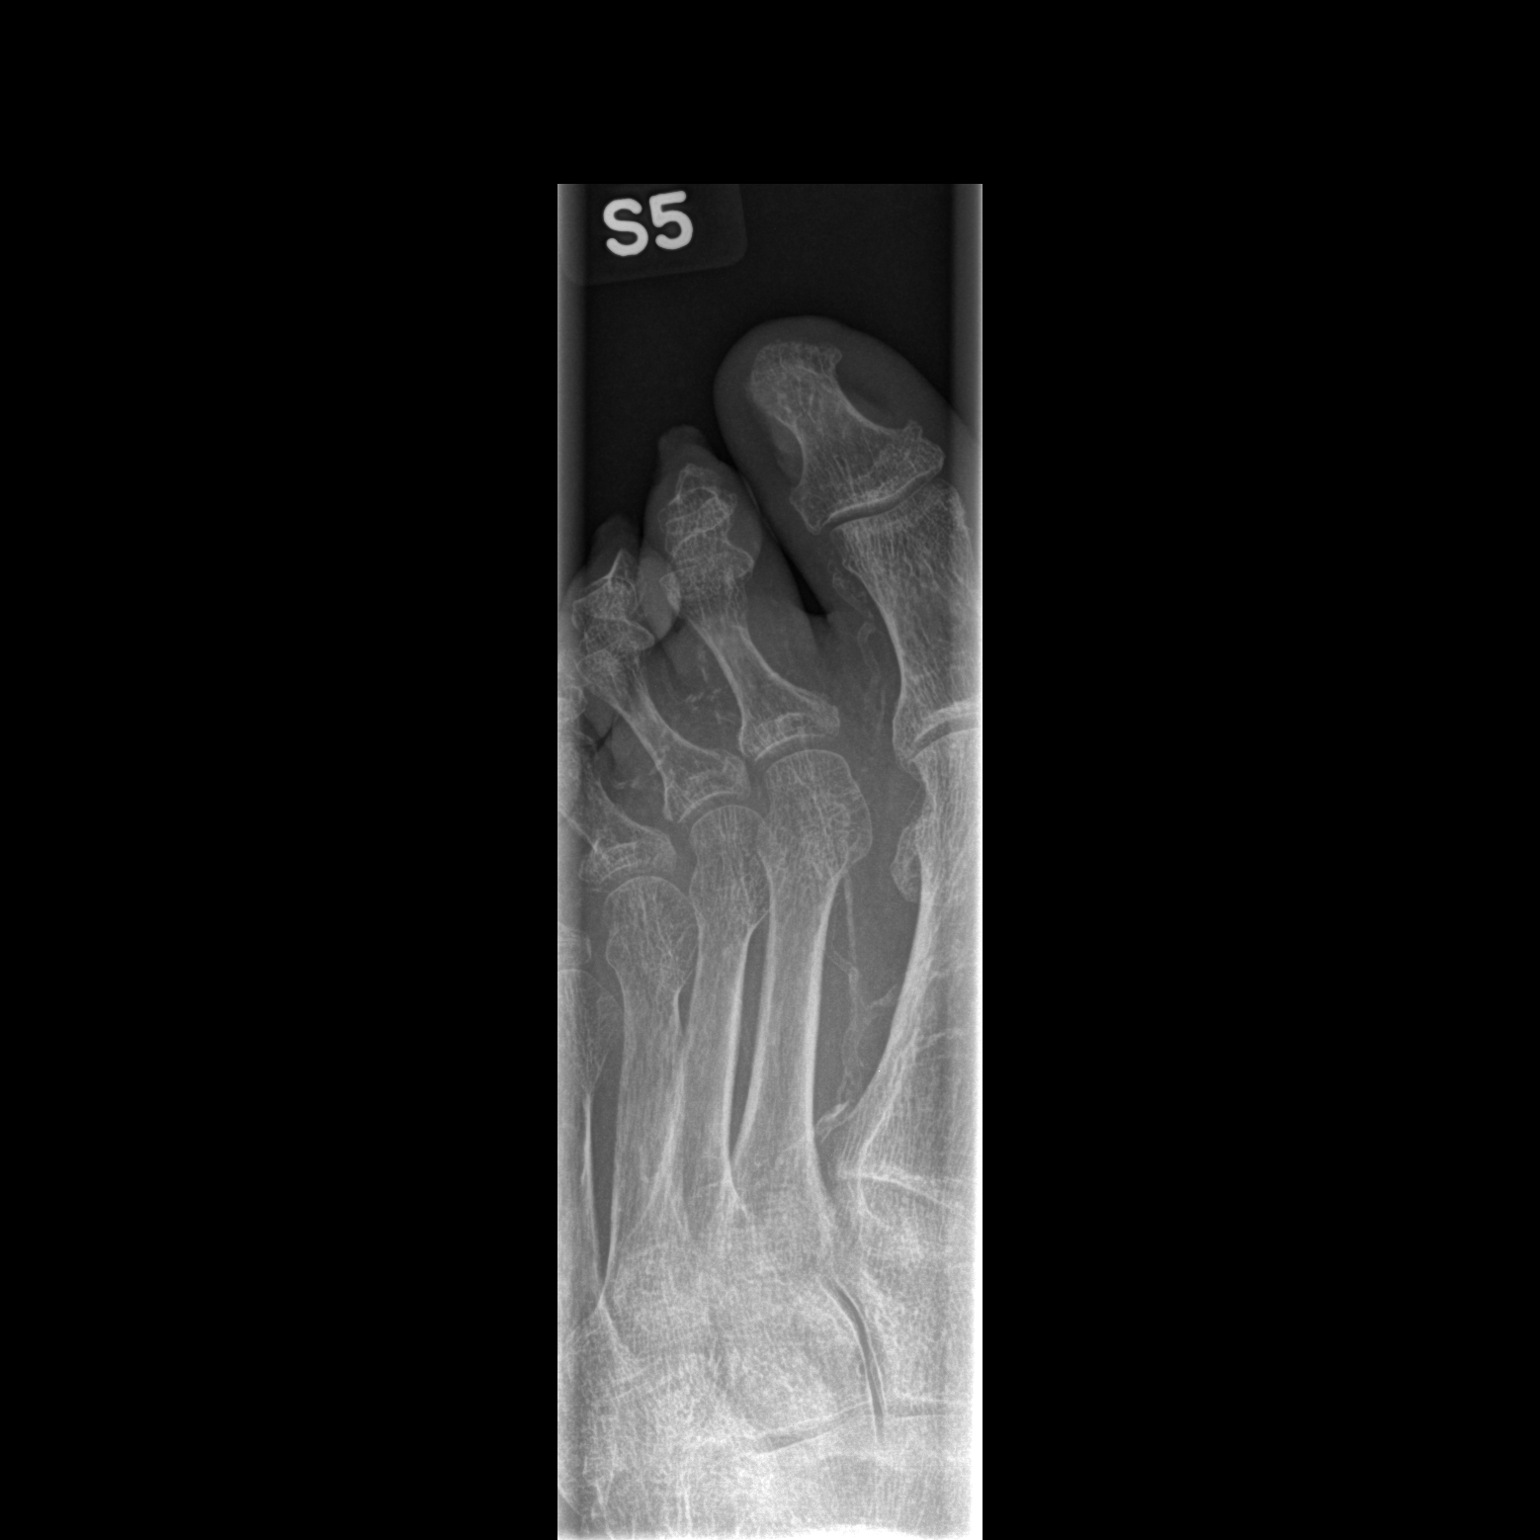

[t toes oblique left]
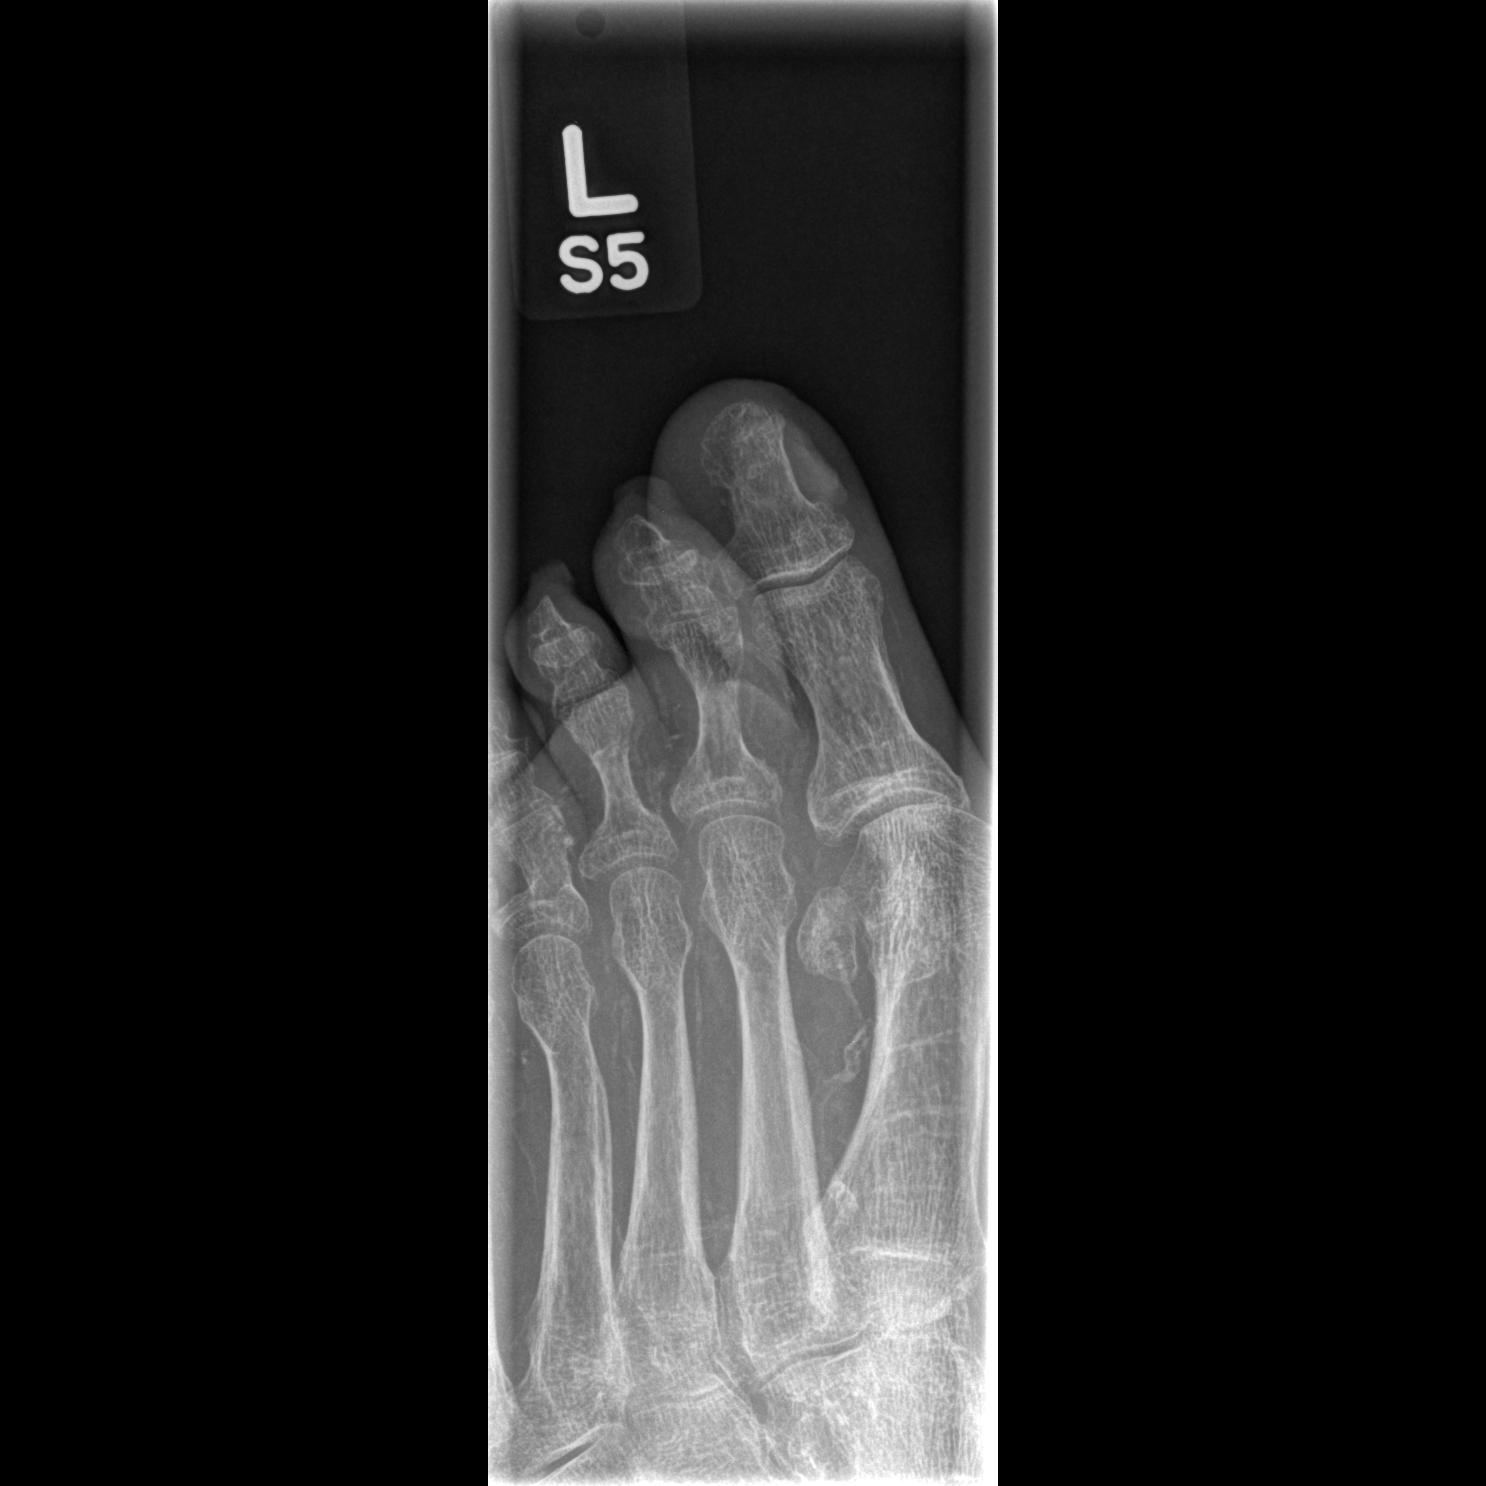

[t toes lateral left]
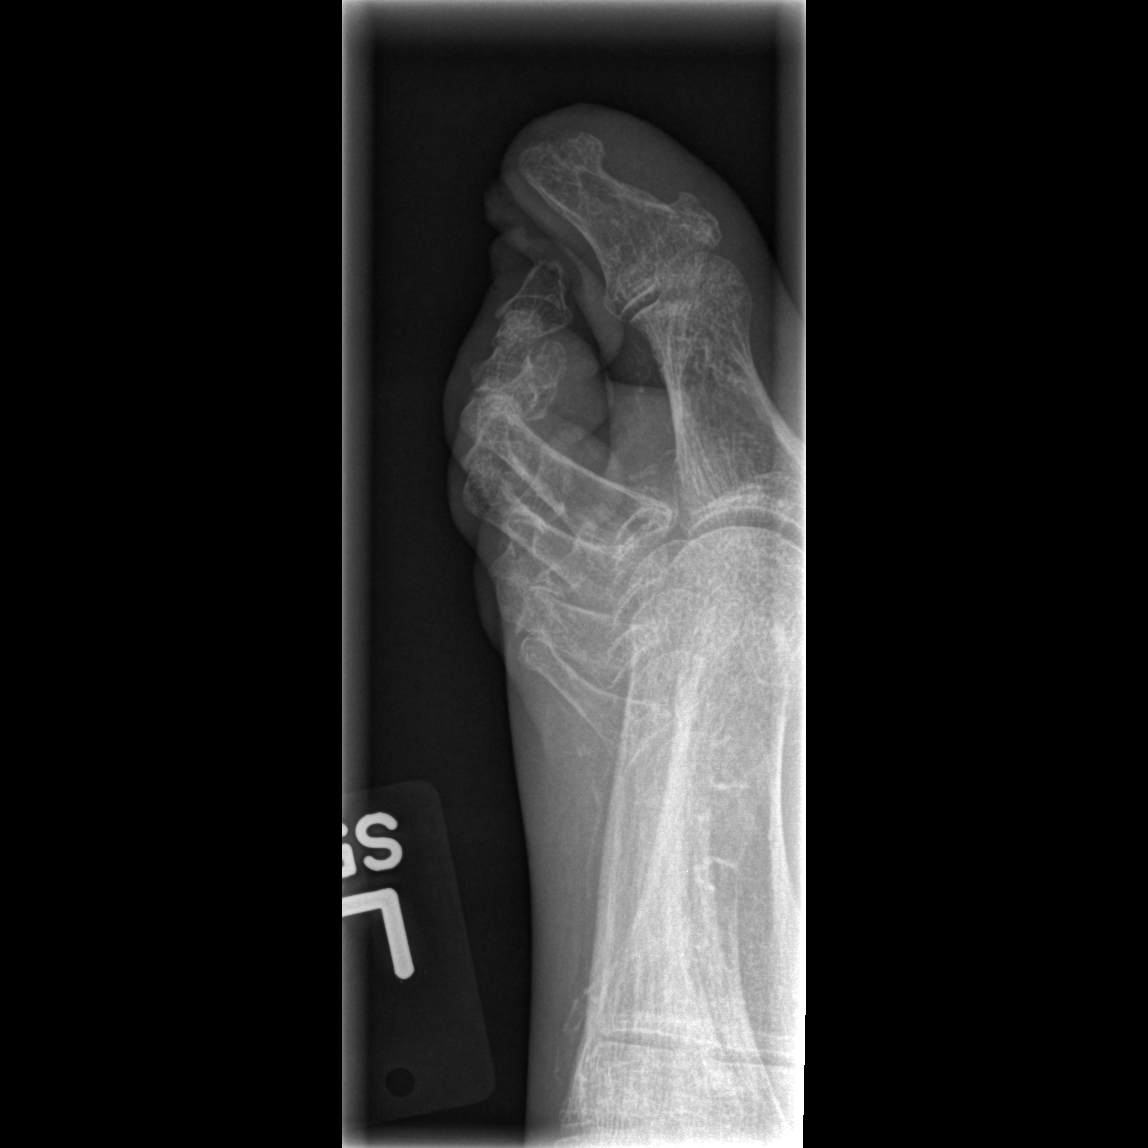

[3 of 3 positions shown; findings below may reference images not displayed]

FINDINGS: Negative for fracture. Negative for osteomyelitis. No significant
soft tissue swelling. No erosion or gouty tophi.

Extensive arterial calcification.
IMPRESSION: No acute abnormality

## 2018-02-07 IMAGING — CR DG FOOT COMPLETE 3+V*L*
3 series · 3 of 3 positions shown · non-contrast
Comparison: Plain films of the left second toe 08/25/2015.

CLINICAL DATA: Nonhealing chronic wounds on the left second and
third toes. No known injury.

EXAM:
LEFT FOOT - COMPLETE 3+ VIEW

[view not recorded (1 of 3)]
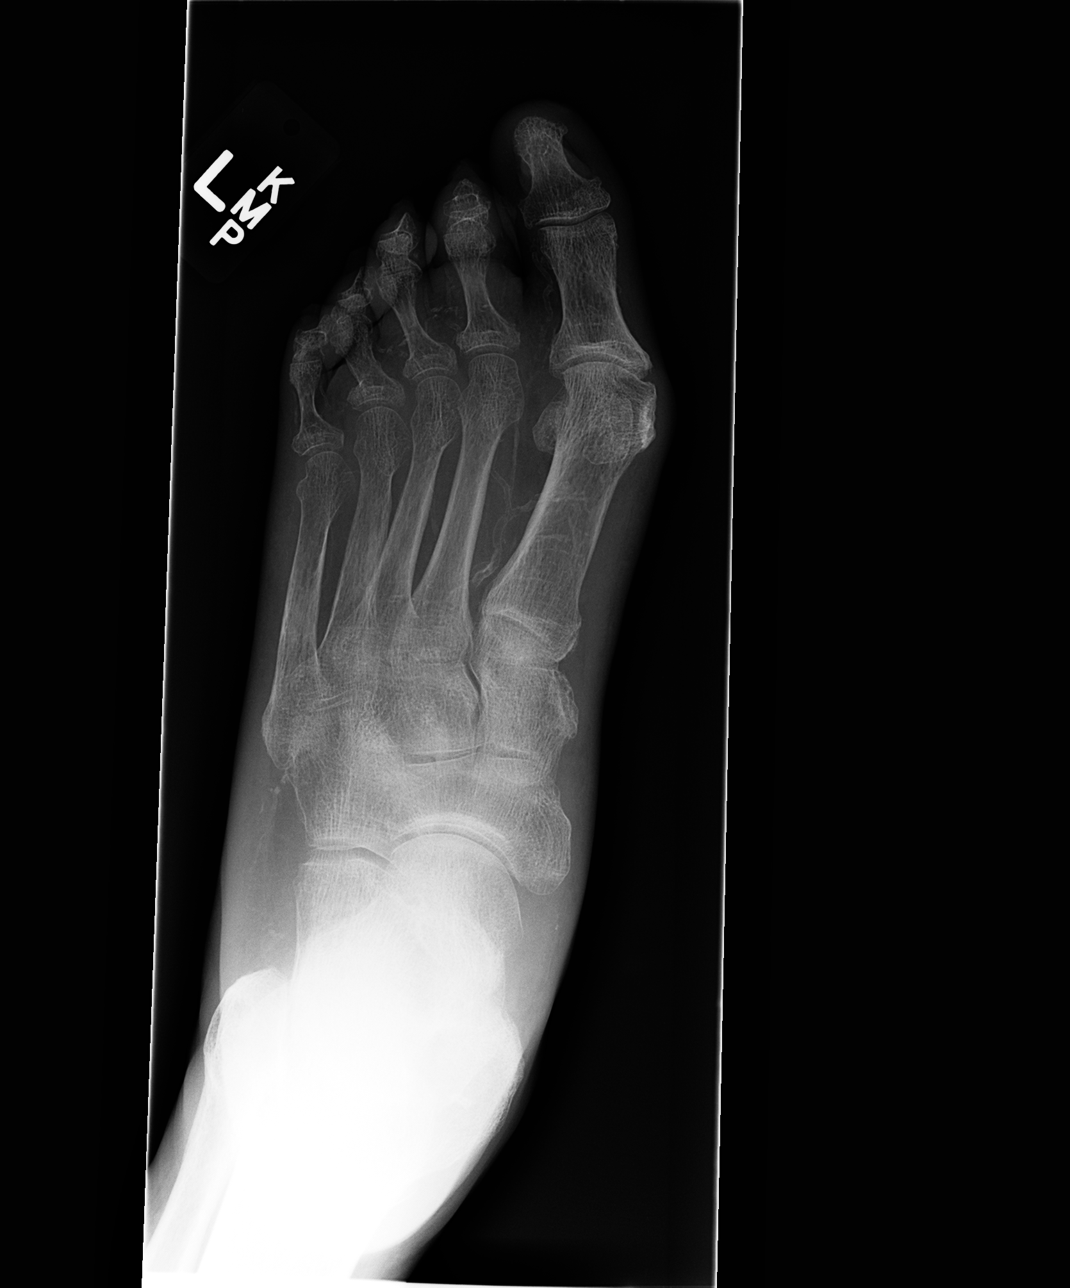

[view not recorded (2 of 3)]
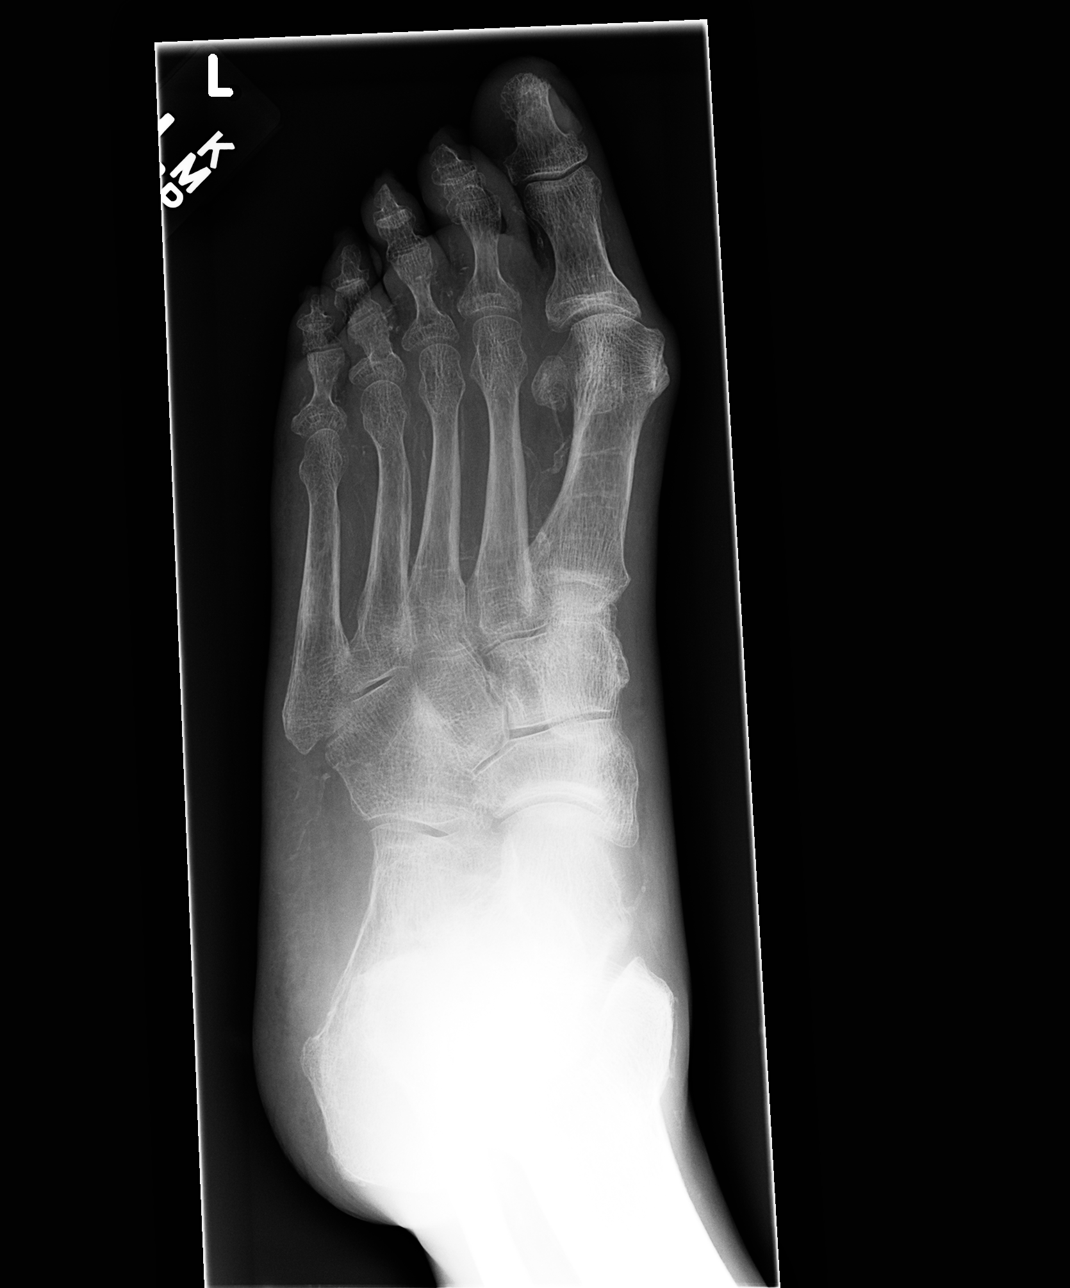

[view not recorded (3 of 3)]
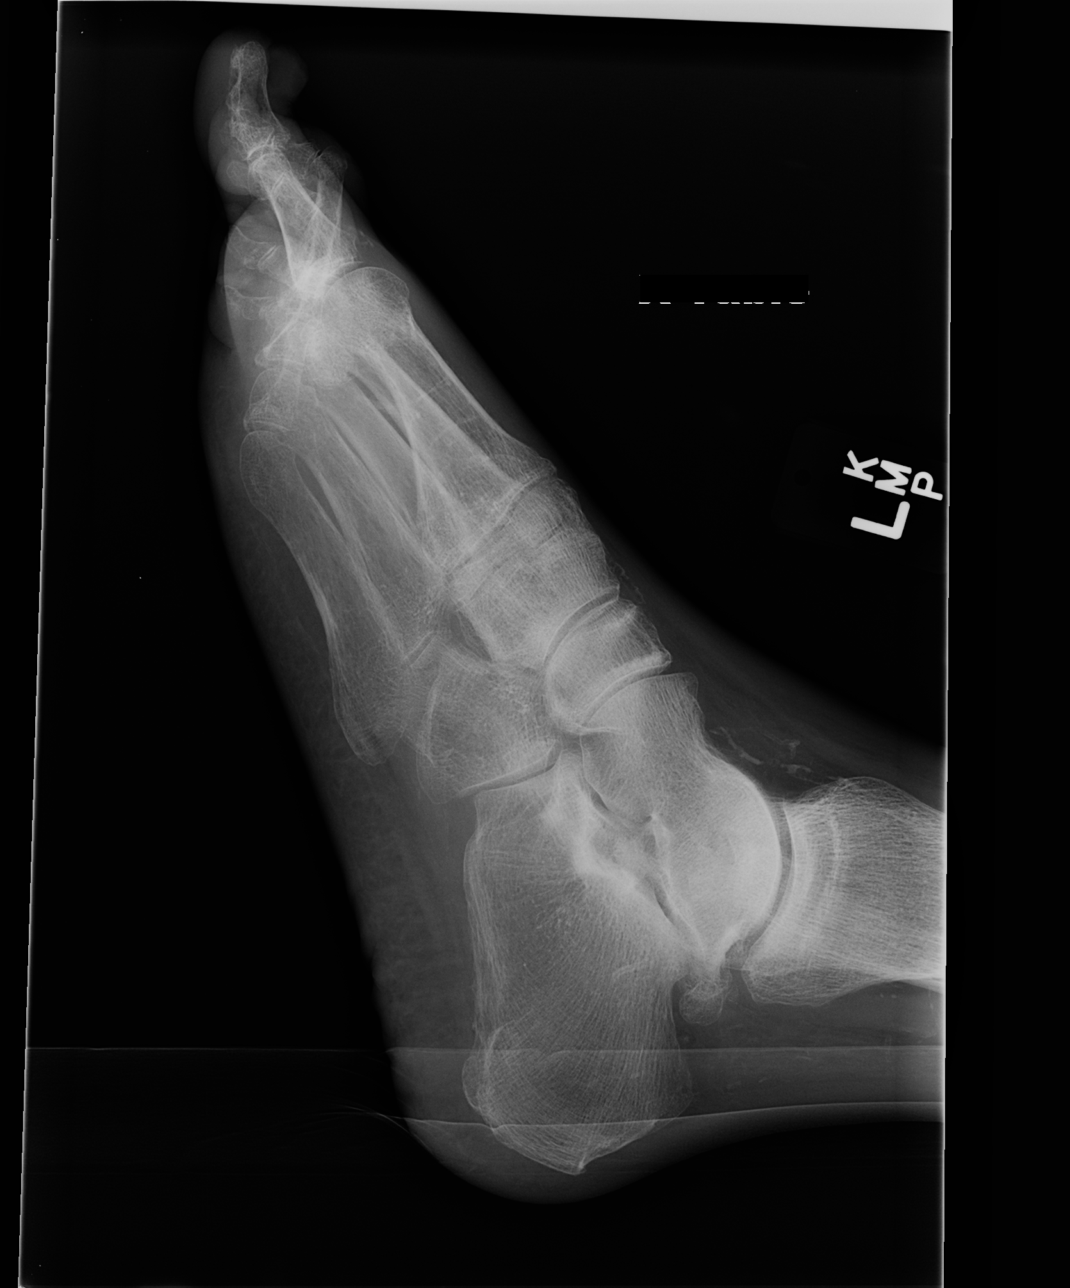

[3 of 3 positions shown; findings below may reference images not displayed]

FINDINGS: No bony destructive change or periosteal reaction is identified.
Soft tissues of the second and third toes appear swollen. Clawtoe
deformities of the second through fifth toes are noted. Extensive
vascular calcifications are seen. First MTP osteoarthritis is
identified.
IMPRESSION: No acute bony or joint abnormality.

Soft tissue swelling of the second and third toes.

Atherosclerosis.
